# Patient Record
Sex: Female | Born: 1970 | Hispanic: No | Smoking: Never smoker
Health system: Southern US, Community
[De-identification: ages and names within clinical notes are randomized; demographics above are authoritative.]

## PROBLEM LIST (undated history)

## (undated) DIAGNOSIS — D649 Anemia, unspecified: Secondary | ICD-10-CM

## (undated) DIAGNOSIS — T7840XA Allergy, unspecified, initial encounter: Secondary | ICD-10-CM

## (undated) DIAGNOSIS — F419 Anxiety disorder, unspecified: Secondary | ICD-10-CM

## (undated) DIAGNOSIS — R0989 Other specified symptoms and signs involving the circulatory and respiratory systems: Secondary | ICD-10-CM

## (undated) HISTORY — DX: Other specified symptoms and signs involving the circulatory and respiratory systems: R09.89

## (undated) HISTORY — PX: BREAST SURGERY: SHX581

## (undated) HISTORY — DX: Allergy, unspecified, initial encounter: T78.40XA

## (undated) HISTORY — DX: Anemia, unspecified: D64.9

## (undated) HISTORY — PX: COSMETIC SURGERY: SHX468

## (undated) HISTORY — DX: Anxiety disorder, unspecified: F41.9

---

## 1998-03-20 ENCOUNTER — Encounter: Admission: RE | Admit: 1998-03-20 | Discharge: 1998-03-20 | Payer: Self-pay | Admitting: *Deleted

## 2010-12-09 ENCOUNTER — Other Ambulatory Visit: Payer: Self-pay | Admitting: Internal Medicine

## 2010-12-09 DIAGNOSIS — N6320 Unspecified lump in the left breast, unspecified quadrant: Secondary | ICD-10-CM

## 2010-12-09 DIAGNOSIS — Z1231 Encounter for screening mammogram for malignant neoplasm of breast: Secondary | ICD-10-CM

## 2010-12-10 ENCOUNTER — Other Ambulatory Visit: Payer: Self-pay | Admitting: Internal Medicine

## 2010-12-10 DIAGNOSIS — M5412 Radiculopathy, cervical region: Secondary | ICD-10-CM

## 2010-12-17 ENCOUNTER — Ambulatory Visit
Admission: RE | Admit: 2010-12-17 | Discharge: 2010-12-17 | Disposition: A | Discharge status: Home / Self Care | Payer: BC Managed Care – PPO | Source: Ambulatory Visit | Attending: Internal Medicine | Admitting: Internal Medicine

## 2010-12-17 DIAGNOSIS — M5412 Radiculopathy, cervical region: Secondary | ICD-10-CM

## 2010-12-18 ENCOUNTER — Ambulatory Visit
Admission: RE | Admit: 2010-12-18 | Discharge: 2010-12-18 | Disposition: A | Discharge status: Home / Self Care | Payer: BC Managed Care – PPO | Source: Ambulatory Visit | Attending: Internal Medicine | Admitting: Internal Medicine

## 2010-12-18 DIAGNOSIS — N6320 Unspecified lump in the left breast, unspecified quadrant: Secondary | ICD-10-CM

## 2011-12-03 ENCOUNTER — Other Ambulatory Visit: Payer: Self-pay | Admitting: Internal Medicine

## 2011-12-03 DIAGNOSIS — Z1231 Encounter for screening mammogram for malignant neoplasm of breast: Secondary | ICD-10-CM

## 2011-12-19 ENCOUNTER — Ambulatory Visit
Admission: RE | Admit: 2011-12-19 | Discharge: 2011-12-19 | Disposition: A | Discharge status: Home / Self Care | Payer: BC Managed Care – PPO | Source: Ambulatory Visit | Attending: Internal Medicine | Admitting: Internal Medicine

## 2011-12-19 ENCOUNTER — Other Ambulatory Visit: Payer: Self-pay | Admitting: Internal Medicine

## 2011-12-19 DIAGNOSIS — Z1231 Encounter for screening mammogram for malignant neoplasm of breast: Secondary | ICD-10-CM

## 2012-03-02 ENCOUNTER — Telehealth: Payer: Self-pay

## 2012-03-02 NOTE — Telephone Encounter (Signed)
Chart pulled to PA pool at nurse station [phone]

## 2012-03-02 NOTE — Telephone Encounter (Signed)
LMOM to RTC. 

## 2012-03-02 NOTE — Telephone Encounter (Signed)
Pt husband calling pt will be leaving to go [place] in a couple of days and pt is needing a rx refill on her diasapam and they would like to know what they need to do in order to have this shipped to them. [phone]

## 2012-03-02 NOTE — Telephone Encounter (Signed)
I do not see where we have Rx this for the patient in the last several years.  She will need an ov.

## 2012-03-02 NOTE — Telephone Encounter (Signed)
Please pull chart and route to PA pool 

## 2012-03-04 ENCOUNTER — Telehealth: Payer: Self-pay

## 2012-03-04 MED ORDER — DIAZEPAM 5 MG PO TABS: 5.0000 mg | ORAL_TABLET | Freq: Two times a day (BID) | ORAL | Status: DC | PRN

## 2012-03-04 MED ORDER — DIAZEPAM 5 MG PO TABS: 5.0000 mg | ORAL_TABLET | Freq: Two times a day (BID) | ORAL | Status: AC | PRN

## 2012-03-04 NOTE — Telephone Encounter (Signed)
PATIENT STATES SHE IS ON A SHIP IN [place]. SHE NEEDS TO GET A PRESCRIPTION FOR DIAZAPAM .5MG . DR. Merla Riches GIVES IT TO HER FOR ANXIETY AND IT HELPS HER TO RELAX HER HAM STRING MUSCLES SO SHE CAN WALK. SHE IS GOING TO BE GONE OUT OF THE COUNTRY FOR 15 DAYS. SHE SAID IF WE FAX IT TO HER HOME FAX #, IT WILL GO THROUGH HER INTERNET AND THEN THROUGH HER E-MAIL AND SHE CAN GET IT PRINTED OUT TO GIVE TO THE PHYSICIAN ON THE SHIP.  HOME FAX [phone]   PATIENT'S CELL IS [phone] - SHE SAID SOMETIMES SHE DOES NOT GET RECEPTION ON HER PHONE DEPENDING ON WHAT AREA SHE IS IN AT THE TIME. IF WE CALL AND DO NOT GET AN ANSWER, PLEASE LEAVE HER A MESSAGE BECAUSE SHE WILL BE ABLE TO LISTEN TO HER VOICE MAIL AND CALL [facility] WHEN SHE GETS CELL PHONE COVERAGE.      MBC

## 2012-03-04 NOTE — Telephone Encounter (Signed)
I have put a Rx into meds and printed.  Please follow her instructions so she can get the Rx.

## 2012-03-04 NOTE — Telephone Encounter (Signed)
Faxed for her/ unable to leave message subscriber outside of calling area.

## 2012-03-22 ENCOUNTER — Ambulatory Visit (INDEPENDENT_AMBULATORY_CARE_PROVIDER_SITE_OTHER): Payer: BC Managed Care – PPO | Admitting: Internal Medicine

## 2012-03-22 VITALS — BP 116/70 | HR 102 | Temp 98.0°F | Resp 16 | Ht 62.0 in | Wt 115.0 lb

## 2012-03-22 DIAGNOSIS — J019 Acute sinusitis, unspecified: Secondary | ICD-10-CM

## 2012-03-22 DIAGNOSIS — M542 Cervicalgia: Secondary | ICD-10-CM

## 2012-03-22 DIAGNOSIS — S79929A Unspecified injury of unspecified thigh, initial encounter: Secondary | ICD-10-CM

## 2012-03-22 DIAGNOSIS — S79919A Unspecified injury of unspecified hip, initial encounter: Secondary | ICD-10-CM

## 2012-03-22 DIAGNOSIS — M79606 Pain in leg, unspecified: Secondary | ICD-10-CM

## 2012-03-22 DIAGNOSIS — M79609 Pain in unspecified limb: Secondary | ICD-10-CM

## 2012-03-22 MED ORDER — DIAZEPAM 5 MG PO TABS: 5.0000 mg | ORAL_TABLET | Freq: Four times a day (QID) | ORAL | Status: DC | PRN

## 2012-03-22 MED ORDER — AMOXICILLIN 500 MG PO CAPS: 1000.0000 mg | ORAL_CAPSULE | Freq: Two times a day (BID) | ORAL | Status: AC

## 2012-03-22 MED ORDER — PROMETHAZINE-CODEINE 6.25-10 MG/5ML PO SYRP: 5.0000 mL | ORAL_SOLUTION | ORAL | Status: DC | PRN

## 2012-03-22 MED ORDER — MELOXICAM 15 MG PO TABS: 15.0000 mg | ORAL_TABLET | Freq: Every day | ORAL | Status: DC

## 2012-03-22 NOTE — Progress Notes (Signed)
  Subjective:    Patient ID: Sandra Mccann, female    DOB: 18-May-1971, 41 y.o.   MRN: [id number]  HPIRecent one-month trip to [place] Developed a right posterior thigh pain climbing steps to [employer] This has persisted/aggravated by exercise No numbness or weakness  Also complaining of a recent increase in neck pain following trip home/radiates to left trapezius C. History/responded to physical therapy O. Madilyn Fireman and is consistent with home exercises MRI 2009 showed mild degenerative changes  Developed a cough 3 weeks ago during her travels which is persisting Is productive Of green sputum especially in the a.m./no fever or night sweats/no sore throat/no chills/possible postnasal drip//Lots of morning congestion with purulent discharge    Review of Systems No fever chills or night sweats Gait unaffected No GU symptoms    Objective:   Physical Exam Vital signs stable HEENT clear except for purulent mucus both nares Throat clear no notes Chest clear to auscultation  Right thigh with tenderness in the posterior thigh at attachment on the iliac crest and into the mid body of the thigh/no exacerbation with dorsiflexion of foot/no cords or masses Hip range of motion intact Straight leg raise to 90 bilaterally normal Deep tendon reflexes symmetrical        Assessment & Plan:   1. Acute sinusitis, unspecified .Marland KitchenASSESSMENT:.amox 1 g twice a day 10 days/promethazine with codeine cough syrup  2. Leg pain -Muscular  3. Thigh injury -Muscle strain at the insertion right posterior thigh  4. Neck pain -Known mild degenerative disease Continue home exercises   Exercises described for thigh muscle strain Ice 20 minutes daily Mobic 15 mg daily Valium 5 mg as needed for muscle spasm/or sleep  Increase neck exercises  Followup if sinus symptoms not improved in one week

## 2012-05-10 ENCOUNTER — Encounter: Payer: Self-pay | Admitting: Internal Medicine

## 2012-05-13 ENCOUNTER — Telehealth: Payer: Self-pay

## 2012-05-13 NOTE — Telephone Encounter (Signed)
Pt says that we referred her to [facility], they have tried to contact this doctor and no phone calls are being returned would like to know if maybe we can refer to another doctor or try to get in touch with Sandra Mccann.

## 2012-05-13 NOTE — Telephone Encounter (Signed)
I called Sandra Mccann in Referrals, patient has made several calls to Sandra Mccann and has gotten no phone calls back, please advise if you can recommend another psychologist for this patient. Amy

## 2012-05-13 NOTE — Telephone Encounter (Signed)
Sandra Mccann at [facility]

## 2012-05-14 NOTE — Telephone Encounter (Signed)
[phone] is the contact # I have called and left message for patient to advise.

## 2012-05-19 ENCOUNTER — Telehealth: Payer: Self-pay | Admitting: Family Medicine

## 2012-05-19 MED ORDER — AMPHETAMINE-DEXTROAMPHETAMINE 10 MG PO TABS: 10.0000 mg | ORAL_TABLET | Freq: Two times a day (BID) | ORAL | Status: DC

## 2012-05-19 NOTE — Telephone Encounter (Signed)
Spoke with patient and she will come by and pick up RX.

## 2012-05-19 NOTE — Telephone Encounter (Signed)
i'd be glad to leave rx for her to pick up  today to start trying if she would like

## 2012-05-19 NOTE — Telephone Encounter (Signed)
Meds ordered this encounter  Medications  . amphetamine-dextroamphetamine (ADDERALL) 10 MG tablet    Sig: Take 1 tablet (10 mg total) by mouth 2 (two) times daily.    Dispense:  60 tablet    Refill:  0   F/u monday

## 2012-05-19 NOTE — Telephone Encounter (Signed)
Per Dr. Netta Corrigan message (Please call her today Psychologist says ahe is ADD by testing Needs atrial of meds???) called patient and notified she needs to come in and see him. She said she would come in Monday 11/25.

## 2012-05-24 ENCOUNTER — Ambulatory Visit (INDEPENDENT_AMBULATORY_CARE_PROVIDER_SITE_OTHER): Payer: BC Managed Care – PPO | Admitting: Internal Medicine

## 2012-05-24 VITALS — BP 99/60 | HR 71 | Temp 97.9°F | Resp 16 | Ht 62.0 in | Wt 111.0 lb

## 2012-05-24 DIAGNOSIS — M549 Dorsalgia, unspecified: Secondary | ICD-10-CM

## 2012-05-24 DIAGNOSIS — B009 Herpesviral infection, unspecified: Secondary | ICD-10-CM

## 2012-05-24 DIAGNOSIS — Z9882 Breast implant status: Secondary | ICD-10-CM

## 2012-05-24 DIAGNOSIS — F988 Other specified behavioral and emotional disorders with onset usually occurring in childhood and adolescence: Secondary | ICD-10-CM

## 2012-05-24 DIAGNOSIS — B001 Herpesviral vesicular dermatitis: Secondary | ICD-10-CM

## 2012-05-24 DIAGNOSIS — R519 Headache, unspecified: Secondary | ICD-10-CM

## 2012-05-24 DIAGNOSIS — M5030 Other cervical disc degeneration, unspecified cervical region: Secondary | ICD-10-CM

## 2012-05-24 MED ORDER — VALACYCLOVIR HCL 1 G PO TABS: 1000.0000 mg | ORAL_TABLET | Freq: Two times a day (BID) | ORAL | Status: DC

## 2012-05-24 NOTE — Progress Notes (Signed)
  Subjective:    Patient ID: Sandra Mccann, female    DOB: 1970/11/13, 41 y.o.   MRN: [id number]  HPI problem #1 recent diagnosis of attention deficit disorder Discussed patient with a value in psychologist Sandra Mccann who favored ADD as a diagnosis but who also was concerned about her level of depression related to the loss of her sister. She is appearing for the LSAT and has not started the medication yet She has used a relatives Adderall at 5 mg with good success for about 4 hours  Problem #2 relapsing low back pain Past MRI showed changes that were amenable to physical therapy and she was referred to PT Sandra Mccann with good success/now problems have recurred  Recently stressed with outbreak of lesions only a as is customary during times of stress  Using morning after pill for contraception when necessary/she dislikes condoms Depression due to sisters' death but in a settled place not affecting life Review of Systems No other symptoms noted    Objective:   Physical Exam Vital signs stable HEENT clear Heart regular Neurological intact       Assessment & Plan:  Problem #1 attention deficit disorder  To start with 10 mg Adderall twice a day and titrate  She may only need medicines in preparation for the LSAT  Problem #2 low back pain  She will reestablish for physical therapy and if fails it may be time for another MRI as the last one was 2009 Set up PE at 1 year after last one Problem #3 mild anxiety/depression/// she feels no need for therapy and I agree  Problem #4 herpes labialis-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 Prenatal Vit-Fe Fumarate-FA (MULTIVITAMIN-PRENATAL) 27-0.8 MG TABS    Sig: Take 1 tablet by mouth daily.  . valACYclovir (VALTREX) 1000 MG tablet    Sig: Take 1 tablet (1,000 mg total) by mouth 2 (two) times daily. At onset of lip blisters    Dispense:  6 tablet    Refill:  8   she has prescription for Adderall and will  followup in 4-8 weeks

## 2012-05-29 ENCOUNTER — Telehealth: Payer: Self-pay | Admitting: Internal Medicine

## 2012-05-29 DIAGNOSIS — M5030 Other cervical disc degeneration, unspecified cervical region: Secondary | ICD-10-CM | POA: Insufficient documentation

## 2012-05-29 DIAGNOSIS — B009 Herpesviral infection, unspecified: Secondary | ICD-10-CM | POA: Insufficient documentation

## 2012-05-29 DIAGNOSIS — Z9882 Breast implant status: Secondary | ICD-10-CM | POA: Insufficient documentation

## 2012-05-29 NOTE — Telephone Encounter (Signed)
Needs appt for CPE in January

## 2012-05-31 NOTE — Telephone Encounter (Signed)
Message left for patient to call to schedule CPE in January with Dr. Merla Riches. Sandra Mccann

## 2012-06-01 NOTE — Telephone Encounter (Signed)
CPE appt made for 07/14/12. Sandra Mccann

## 2012-06-08 ENCOUNTER — Other Ambulatory Visit: Payer: Self-pay | Admitting: Internal Medicine

## 2012-07-14 ENCOUNTER — Ambulatory Visit: Payer: BC Managed Care – PPO

## 2012-07-14 ENCOUNTER — Encounter: Payer: Self-pay | Admitting: Internal Medicine

## 2012-07-14 ENCOUNTER — Ambulatory Visit (INDEPENDENT_AMBULATORY_CARE_PROVIDER_SITE_OTHER): Payer: BC Managed Care – PPO | Admitting: Internal Medicine

## 2012-07-14 VITALS — BP 102/65 | HR 75 | Temp 98.1°F | Resp 16 | Ht 62.0 in | Wt 104.0 lb

## 2012-07-14 DIAGNOSIS — R5381 Other malaise: Secondary | ICD-10-CM

## 2012-07-14 DIAGNOSIS — Z0000 Encounter for general adult medical examination without abnormal findings: Secondary | ICD-10-CM

## 2012-07-14 DIAGNOSIS — M25559 Pain in unspecified hip: Secondary | ICD-10-CM

## 2012-07-14 DIAGNOSIS — M25552 Pain in left hip: Secondary | ICD-10-CM

## 2012-07-14 DIAGNOSIS — M545 Low back pain, unspecified: Secondary | ICD-10-CM

## 2012-07-14 DIAGNOSIS — R5383 Other fatigue: Secondary | ICD-10-CM

## 2012-07-14 DIAGNOSIS — M25551 Pain in right hip: Secondary | ICD-10-CM

## 2012-07-14 DIAGNOSIS — R634 Abnormal weight loss: Secondary | ICD-10-CM

## 2012-07-14 DIAGNOSIS — M542 Cervicalgia: Secondary | ICD-10-CM

## 2012-07-14 LAB — COMPREHENSIVE METABOLIC PANEL
ALT: 10 U/L (ref 0–35)
AST: 12 U/L (ref 0–37)
Albumin: 3.9 g/dL (ref 3.5–5.2)
Alkaline Phosphatase: 42 U/L (ref 39–117)
BUN: 10 mg/dL (ref 6–23)
Calcium: 9 mg/dL (ref 8.4–10.5)
Chloride: 106 mEq/L (ref 96–112)
Potassium: 4.2 mEq/L (ref 3.5–5.3)
Sodium: 137 mEq/L (ref 135–145)

## 2012-07-14 LAB — CBC WITH DIFFERENTIAL/PLATELET
Basophils Absolute: 0 10*3/uL (ref 0.0–0.1)
Basophils Relative: 1 % (ref 0–1)
Lymphocytes Relative: 45 % (ref 12–46)
MCHC: 34.1 g/dL (ref 30.0–36.0)
Neutro Abs: 2.8 10*3/uL (ref 1.7–7.7)
Neutrophils Relative %: 47 % (ref 43–77)
Platelets: 221 10*3/uL (ref 150–400)
RDW: 13.7 % (ref 11.5–15.5)
WBC: 5.9 10*3/uL (ref 4.0–10.5)

## 2012-07-14 LAB — POCT URINALYSIS DIPSTICK
Blood, UA: NEGATIVE
Nitrite, UA: NEGATIVE
Protein, UA: NEGATIVE
Spec Grav, UA: <=1.005
Urobilinogen, UA: 0.2

## 2012-07-14 LAB — LIPID PANEL
Cholesterol: 177 mg/dL (ref 0–200)
LDL Cholesterol: 115 mg/dL — ABNORMAL HIGH (ref 0–99)
Total CHOL/HDL Ratio: 4.4 Ratio
VLDL: 22 mg/dL (ref 0–40)

## 2012-07-14 LAB — POCT SEDIMENTATION RATE: POCT SED RATE: 28 mm/hr — AB (ref 0–22)

## 2012-07-14 MED ORDER — PRENATAL 27-0.8 MG PO TABS: 1.0000 | ORAL_TABLET | Freq: Every day | ORAL | Status: DC

## 2012-07-14 MED ORDER — MELOXICAM 15 MG PO TABS: 15.0000 mg | ORAL_TABLET | Freq: Every day | ORAL | Status: DC

## 2012-07-14 NOTE — Progress Notes (Signed)
Subjective:    Patient ID: Sandra Mccann, female    DOB: [birth year], 42 y.o.   MRN: [id number]  HPICPE Also with several complaints During recent trip to [place] she developed left hip pain which made it hard to walk. Her sister in law, a nurse in [place], injected her hip with Depo-Medrol and she improved. The pain has returned 3 weeks later and bothers her gait/unable to exercise in the gym. She has a more persistent longer-term problem with pain in the right hip at the lower buttock extending into the posterior thigh with an aching and burning sensation. This occurs mostly when sitting for long periods and occasionally at night. She rarely feels this pain with exercise. She also continues to have lumbosacral discomfort when sitting to work and occasional stiffness as she tries to begin exercise. She's usually very active. The back pain does not hurt at night. She has no numbness or weakness in her extremities  She visited her family in [place] for 3 weeks and had a good time. She sleeps well and feels no stress in [place]. Since returning last week her mood has been depressed. 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 She often has thoracic and neck tightness due to stress.   PMH- -DDDneck stable//MRI 12/17/10=sl disc bulge at 3-4,4-5,5-6(same on mri 12/09) -LDL 129 05/2011  Pap 2011 wnl/Mammo 10/13 wnl  FH- Mom 70s/working hard-healthy x mild aodm recent([place])  1 sis deceased melanoma(also GM)  SH-awaiting LSAT results  Review of Systems  Constitutional: Positive for fatigue and unexpected weight change. Negative for fever, chills, diaphoresis, activity change and appetite change.       -Weight loss from 1:15 to 104 since September 2013 without a change in appetite  -Mild pain R breast this week after travels/sharp-brief-better today  HENT: Positive for neck stiffness. Negative for hearing loss, ear pain, congestion and trouble  swallowing.        Recent vesicular lesion on lip responded to Valtrex  Eyes: Negative for visual disturbance.  Respiratory: Negative for cough and shortness of breath.   Cardiovascular: Negative for chest pain, palpitations and leg swelling.  Gastrointestinal: Negative for nausea, vomiting, abdominal pain, diarrhea and constipation.  Genitourinary: Negative for dysuria, frequency, difficulty urinating, menstrual problem and pelvic pain.  Musculoskeletal: Negative for joint swelling and arthralgias.  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       Hx menstr migraine but no recent probs since on ocps  Hematological: Negative for adenopathy. Does not bruise/bleed easily.  Psychiatric/Behavioral: Negative for behavioral problems and sleep disturbance.       Adderall helped with distractibility during study time that she does not intend to take this on a regular basis       Objective:   Physical Exam  Constitutional: She is oriented to person, place, and time. She appears well-developed.       Appears thin  HENT:  Right Ear: Tympanic membrane and external ear normal.  Left Ear: Tympanic membrane and external ear normal.  Nose: Nose normal. No septal deviation.  Mouth/Throat: Oropharynx is clear and moist.  Eyes: Conjunctivae normal and EOM are normal. Pupils are equal, round, and reactive to light.  Neck: Normal range of motion. Neck supple. No thyromegaly present.  Cardiovascular: Normal rate, regular rhythm, normal heart sounds and intact distal pulses.   No murmur heard. Pulmonary/Chest:  Effort normal and breath sounds normal. Right breast exhibits no inverted nipple, no mass, no nipple discharge, no skin change and no tenderness. Left breast exhibits no inverted nipple, no mass, no nipple discharge, no skin change and no tenderness. Breasts are symmetrical.  Abdominal: Soft. Bowel sounds are normal. She  exhibits no mass. There is no hepatosplenomegaly. There is no tenderness.  Musculoskeletal: She exhibits no edema.       The left hip has good range of motion with pain at full external rotation with the knee flexed, and with direct palpation over the trochanter/there is no crepitus  The right hip has tenderness directly on the issue of tuberosity and extending into the posterior thigh to mid thigh/no defect  There is tenderness bilaterally over the iliac crests which is mild and which does not affect range of motion of the lumbar spine/ straight leg raise to 90 is intact bilaterally  Lymphadenopathy:    She has no cervical adenopathy.  Neurological: She is alert and oriented to person, place, and time. She has normal reflexes. No cranial nerve deficit or sensory deficit. She exhibits normal muscle tone. Coordination normal.  Skin: No rash noted.  Psychiatric: She has a normal mood and affect. Her behavior is normal. Judgment and thought content normal.    [facility] reading (PRIMARY) by  Dr. Christianne Dolin hips and ls spine WNL  Results for orders placed in visit on 07/14/12  CBC WITH DIFFERENTIAL      Component Value Range   WBC 5.9  4.0 - 10.5 K/uL   RBC 4.25  3.87 - 5.11 MIL/uL   Hemoglobin 12.9  12.0 - 15.0 g/dL   HCT 09.8  11.9 - 14.7 %   MCV 88.9  78.0 - 100.0 fL   MCH 30.4  26.0 - 34.0 pg   MCHC 34.1  30.0 - 36.0 g/dL   RDW 82.9  56.2 - 13.0 %   Platelets 221  150 - 400 K/uL   Neutrophils Relative 47  43 - 77 %   Neutro Abs 2.8  1.7 - 7.7 K/uL   Lymphocytes Relative 45  12 - 46 %   Lymphs Abs 2.7  0.7 - 4.0 K/uL   Monocytes Relative 6  3 - 12 %   Monocytes Absolute 0.4  0.1 - 1.0 K/uL   Eosinophils Relative 1  0 - 5 %   Eosinophils Absolute 0.1  0.0 - 0.7 K/uL   Basophils Relative 1  0 - 1 %   Basophils Absolute 0.0  0.0 - 0.1 K/uL   Smear Review Criteria for review not met    COMPREHENSIVE METABOLIC PANEL      Component Value Range   Sodium 137  135 - 145 mEq/L    Potassium 4.2  3.5 - 5.3 mEq/L   Chloride 106  96 - 112 mEq/L   CO2 24  19 - 32 mEq/L   Glucose, Bld 85  70 - 99 mg/dL   BUN 10  6 - 23 mg/dL   Creat 8.65  7.84 - 6.96 mg/dL   Total Bilirubin 0.4  0.3 - 1.2 mg/dL   Alkaline Phosphatase 42  39 - 117 U/L   AST 12  0 - 37 U/L   ALT 10  0 - 35 U/L   Total Protein 6.5  6.0 - 8.3 g/dL   Albumin 3.9  3.5 - 5.2 g/dL   Calcium 9.0  8.4 - 29.5 mg/dL  TSH      Component Value  Range   TSH 1.596  0.350 - 4.500 uIU/mL  VITAMIN D 25 HYDROXY      Component Value Range   Vit D, 25-Hydroxy 28 (*) 30 - 89 ng/mL  LIPID PANEL      Component Value Range   Cholesterol 177  0 - 200 mg/dL   Triglycerides 161  <096 mg/dL   HDL 40  >04 mg/dL   Total CHOL/HDL Ratio 4.4     VLDL 22  0 - 40 mg/dL   LDL Cholesterol 540 (*) 0 - 99 mg/dL  POCT URINALYSIS DIPSTICK      Component Value Range   Color, UA yellow     Clarity, UA clear     Glucose, UA neg     Bilirubin, UA neg     Ketones, UA neg     Spec Grav, UA <=1.005     Blood, UA neg     pH, UA 5.5     Protein, UA neg     Urobilinogen, UA 0.2     Nitrite, UA neg     Leukocytes, UA Negative    POCT SEDIMENTATION RATE      Component Value Range   POCT SED RATE 28 (*) 0 - 22 mm/hr         Assessment & Plan:  Annual Exam P#1 Weight Loss P#2 fatigue P#3 L Hip Pain-bursitis P#4 R hip /thigh pain due to strain at ishial tuberosity P#5 DDD-cervical P#6 LBP-muscular P#7 mild anxiety  mobic 15 qd 1-2 months Ref back to PT O'Halloran Cont ocps(may ref 1 yr) Cont prn valium(rare use-call if out) Cont prenat vit(her choice) CBC,CMET,TSH,VIT D,Lipids as above-are reassuring Recheck weight in 1 month

## 2012-07-19 ENCOUNTER — Encounter: Payer: Self-pay | Admitting: Internal Medicine

## 2012-09-01 ENCOUNTER — Ambulatory Visit: Payer: BC Managed Care – PPO | Admitting: Internal Medicine

## 2012-09-22 ENCOUNTER — Other Ambulatory Visit: Payer: Self-pay | Admitting: Physician Assistant

## 2012-09-22 MED ORDER — NORETHIN-ETH ESTRAD TRIPHASIC 0.5/0.75/1-35 MG-MCG PO TABS: 1.0000 | ORAL_TABLET | Freq: Every day | ORAL | Status: DC

## 2012-09-22 NOTE — Telephone Encounter (Signed)
Done Meds ordered this encounter  Medications  . norethindrone-ethinyl estradiol (ORTHO-NOVUM 7/7/7, 28,) 0.5/0.75/1-35 MG-MCG tablet    Sig: Take 1 tablet by mouth daily.    Dispense:  28 tablet    Refill:  11    May refill through March 2015

## 2012-09-22 NOTE — Telephone Encounter (Signed)
Dr Merla Riches, pt saw you for CPE in Jan, but not sure whether BCP discussed. Do you want to RF BCPs for pt?

## 2012-12-02 ENCOUNTER — Other Ambulatory Visit: Payer: Self-pay

## 2012-12-02 DIAGNOSIS — Z1231 Encounter for screening mammogram for malignant neoplasm of breast: Secondary | ICD-10-CM

## 2012-12-10 ENCOUNTER — Other Ambulatory Visit: Payer: Self-pay | Admitting: Internal Medicine

## 2013-01-07 ENCOUNTER — Ambulatory Visit
Admission: RE | Admit: 2013-01-07 | Discharge: 2013-01-07 | Disposition: A | Discharge status: Home / Self Care | Payer: BC Managed Care – PPO | Source: Ambulatory Visit

## 2013-01-07 DIAGNOSIS — Z1231 Encounter for screening mammogram for malignant neoplasm of breast: Secondary | ICD-10-CM

## 2013-01-10 ENCOUNTER — Other Ambulatory Visit: Payer: Self-pay | Admitting: Internal Medicine

## 2013-01-10 DIAGNOSIS — N63 Unspecified lump in unspecified breast: Secondary | ICD-10-CM

## 2013-01-13 ENCOUNTER — Telehealth: Payer: Self-pay

## 2013-01-13 DIAGNOSIS — N6320 Unspecified lump in the left breast, unspecified quadrant: Secondary | ICD-10-CM

## 2013-01-13 NOTE — Telephone Encounter (Signed)
Ok ordered

## 2013-01-13 NOTE — Telephone Encounter (Signed)
Called patient she has gone for screening Mammogram. Was told she needs diagnostic order patient has a lump left breast. Also she would need [place] of left breast. Pended these, please sign if you agree, if you need to see patient, let me know and I will call her.

## 2013-01-13 NOTE — Telephone Encounter (Signed)
Pt is calling to talk with someone about getting a referral   Best number [phone]

## 2013-01-26 ENCOUNTER — Ambulatory Visit
Admission: RE | Admit: 2013-01-26 | Discharge: 2013-01-26 | Disposition: A | Discharge status: Home / Self Care | Payer: BC Managed Care – PPO | Source: Ambulatory Visit | Attending: Internal Medicine | Admitting: Internal Medicine

## 2013-01-26 ENCOUNTER — Other Ambulatory Visit: Payer: Self-pay | Admitting: Internal Medicine

## 2013-01-26 DIAGNOSIS — N6320 Unspecified lump in the left breast, unspecified quadrant: Secondary | ICD-10-CM

## 2013-01-26 DIAGNOSIS — N6310 Unspecified lump in the right breast, unspecified quadrant: Secondary | ICD-10-CM

## 2013-03-26 ENCOUNTER — Ambulatory Visit: Payer: BC Managed Care – PPO

## 2013-03-26 ENCOUNTER — Ambulatory Visit (INDEPENDENT_AMBULATORY_CARE_PROVIDER_SITE_OTHER): Payer: BC Managed Care – PPO | Admitting: Internal Medicine

## 2013-03-26 VITALS — BP 120/70 | HR 73 | Temp 98.2°F | Resp 18 | Ht 62.0 in | Wt 113.0 lb

## 2013-03-26 DIAGNOSIS — N644 Mastodynia: Secondary | ICD-10-CM

## 2013-03-26 DIAGNOSIS — M25559 Pain in unspecified hip: Secondary | ICD-10-CM

## 2013-03-26 DIAGNOSIS — N760 Acute vaginitis: Secondary | ICD-10-CM

## 2013-03-26 DIAGNOSIS — M542 Cervicalgia: Secondary | ICD-10-CM

## 2013-03-26 DIAGNOSIS — Z01419 Encounter for gynecological examination (general) (routine) without abnormal findings: Secondary | ICD-10-CM

## 2013-03-26 DIAGNOSIS — G8929 Other chronic pain: Secondary | ICD-10-CM

## 2013-03-26 LAB — POCT URINALYSIS DIPSTICK
Blood, UA: NEGATIVE
Nitrite, UA: NEGATIVE
Urobilinogen, UA: 0.2
pH, UA: 6

## 2013-03-26 LAB — POCT WET PREP WITH KOH
KOH Prep POC: NEGATIVE
Yeast Wet Prep HPF POC: NEGATIVE

## 2013-03-26 MED ORDER — NORETHIN-ETH ESTRAD TRIPHASIC 0.5/0.75/1-35 MG-MCG PO TABS: 1.0000 | ORAL_TABLET | Freq: Every day | ORAL | Status: DC

## 2013-03-26 MED ORDER — MELOXICAM 15 MG PO TABS: ORAL_TABLET | ORAL | Status: DC

## 2013-03-26 MED ORDER — DIAZEPAM 5 MG PO TABS: 5.0000 mg | ORAL_TABLET | Freq: Four times a day (QID) | ORAL | Status: DC | PRN

## 2013-03-26 NOTE — Progress Notes (Signed)
Subjective:    Patient ID: Sandra Mccann, female    DOB: 28-Feb-1971, 42 y.o.   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 Pain now affecting activity  Also needs PAP and ocp refills-doing well tho notes a funny burning sensation over the pubic mound not associated with urinary symptoms or with vaginal discharge/not really an itch Almost more of a pressure/comes and goes/seems to get worse if she sits for a long time to study Denies dyspareunia/married/no outside partner  Nephew/wife now staying with them--also stressful Husband wants to open brewery-working long hours to get trained  Review of Systems Otalgia L without change in hearing intermittently for the past month No nasal congestion or allergies No change in hearing Breast tenderness L-recent/? Swelling/US showed bubbling of prosthesis Cardiovascular negative Pulmonary negative Gastrointestinal negative    Objective:   Physical Exam BP 120/70  Pulse 73  Temp(Src) 98.2 F (36.8 C) (Oral)  Resp 18  Ht 5\' 2"  (1.575 m)  Wt 113 lb (51.256 kg)  BMI 20.66 kg/m2  SpO2 99%  LMP 03/18/2013 NAD Tms and canals clear-tender to palp over L TMJ/opens well and bites w/out pain Remainder of the EENT clear No thyromegaly Heart regular without murmur Lungs clear Abdomen supple without organomegaly There is a mild tenderness over the pubic ramus without a defined mass Introitus clear/os clear Uterus mid position and nontender No adnexal masses Neck with decreased range of motion secondary to discomfort with tenderness to palpation in the left posterior cervical  area No peripheral sensory or motor losses Straight leg raise to 90 within normal limits Spine straight Left hip tender to palpation along the iliotibial band proximally Hip has good range of motion/there is some pain with external rotation of the hip     [facility] reading (PRIMARY) by  Dr.Vittorio Mohs=no bony problems within pelvic girdle  Results for orders placed in visit on 03/26/13  POCT WET PREP WITH KOH      Result Value Range   Trichomonas, UA Negative     Clue Cells Wet Prep HPF POC 1-3     Epithelial Wet Prep HPF POC 5-10     Yeast Wet Prep HPF POC neg     Bacteria Wet Prep HPF POC 4+     RBC Wet Prep HPF POC 0-1     WBC Wet Prep HPF POC 0-1     KOH Prep POC Negative    POCT URINALYSIS DIPSTICK      Result Value Range   Color, UA light yellow     Clarity, UA clear     Glucose, UA neg     Bilirubin, UA neg     Ketones, UA neg     Spec Grav, UA <=1.005     Blood, UA neg     pH, UA 6.0     Protein, UA neg     Urobilinogen, UA 0.2     Nitrite, UA neg     Leukocytes, UA Negative       Assessment & Plan:  Pain in  pelvic region -?etiology---if all labs wnl will consider pelvic US  Neck pain-relapsing-- Refer Dr Penni Bombard  for eval/directing PT  Hip pain, chronic, left -recurrence--Refer Dr Penni Bombard for eval/directing PT  Routine gynecological examination - Plan: Pap IG, CT/NG w/ reflex HPV  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 norethindrone-ethinyl estradiol (ORTHO-NOVUM 7/7/7, 28,) 0.5/0.75/1-35 MG-MCG tablet    Sig: Take 1 tablet by mouth daily.    Dispense:  28 tablet    Refill:  11  . meloxicam (MOBIC) 15 MG tablet    Sig: TAKE 1 TABLET (15 MG TOTAL) BY MOUTH DAILY.     Dispense:  30 tablet    Refill:  2  . diazepam (VALIUM) 5 MG tablet    Sig: Take 1 tablet (5 mg total) by mouth every 6 (six) hours as needed for anxiety.    Dispense:  30 tablet    Refill:  5    OV

## 2013-03-28 LAB — PAP IG, CT-NG, RFX HPV ASCU: Chlamydia Probe Amp: NEGATIVE

## 2013-03-30 ENCOUNTER — Encounter: Payer: Self-pay | Admitting: Internal Medicine

## 2013-03-31 ENCOUNTER — Telehealth: Payer: Self-pay

## 2013-03-31 NOTE — Telephone Encounter (Signed)
Patient would like to get a generic prescription for birth control. The one that was prescribed previously is $30 and patient cannot afford. [facility].   [phone]

## 2013-03-31 NOTE — Telephone Encounter (Signed)
It looks like pt has always been on this particular pill.  The branded version comes with 7 placebo pills at the end of the pack.  The generic version just comes with the 21 active pills and no placebo.  It works the same way, but some people like the consistency of continuing to take the placebo.  If you do not have the placebo, you have to remember to restart the pill after a week.  So, we can do the generic version of the pill she is on, but without the placebo.  Or we can try a new generic pill.  Which would she prefer?

## 2013-04-01 NOTE — Telephone Encounter (Signed)
Left message for her to call me back. 

## 2013-04-03 NOTE — Telephone Encounter (Signed)
Called pharmacy and they did do the generic for pt.  I advised pt that she should call her ins to see what her formulary will pay for and call us back with what they will pay for.

## 2013-05-05 ENCOUNTER — Encounter: Payer: Self-pay | Admitting: *Deleted

## 2013-05-09 IMAGING — CR DG LUMBAR SPINE 2-3V
2 series · 2 of 2 positions shown · non-contrast
Comparison: None.

CLINICAL DATA: Back pain.

LUMBAR SPINE - 2-3 VIEW

[AP]
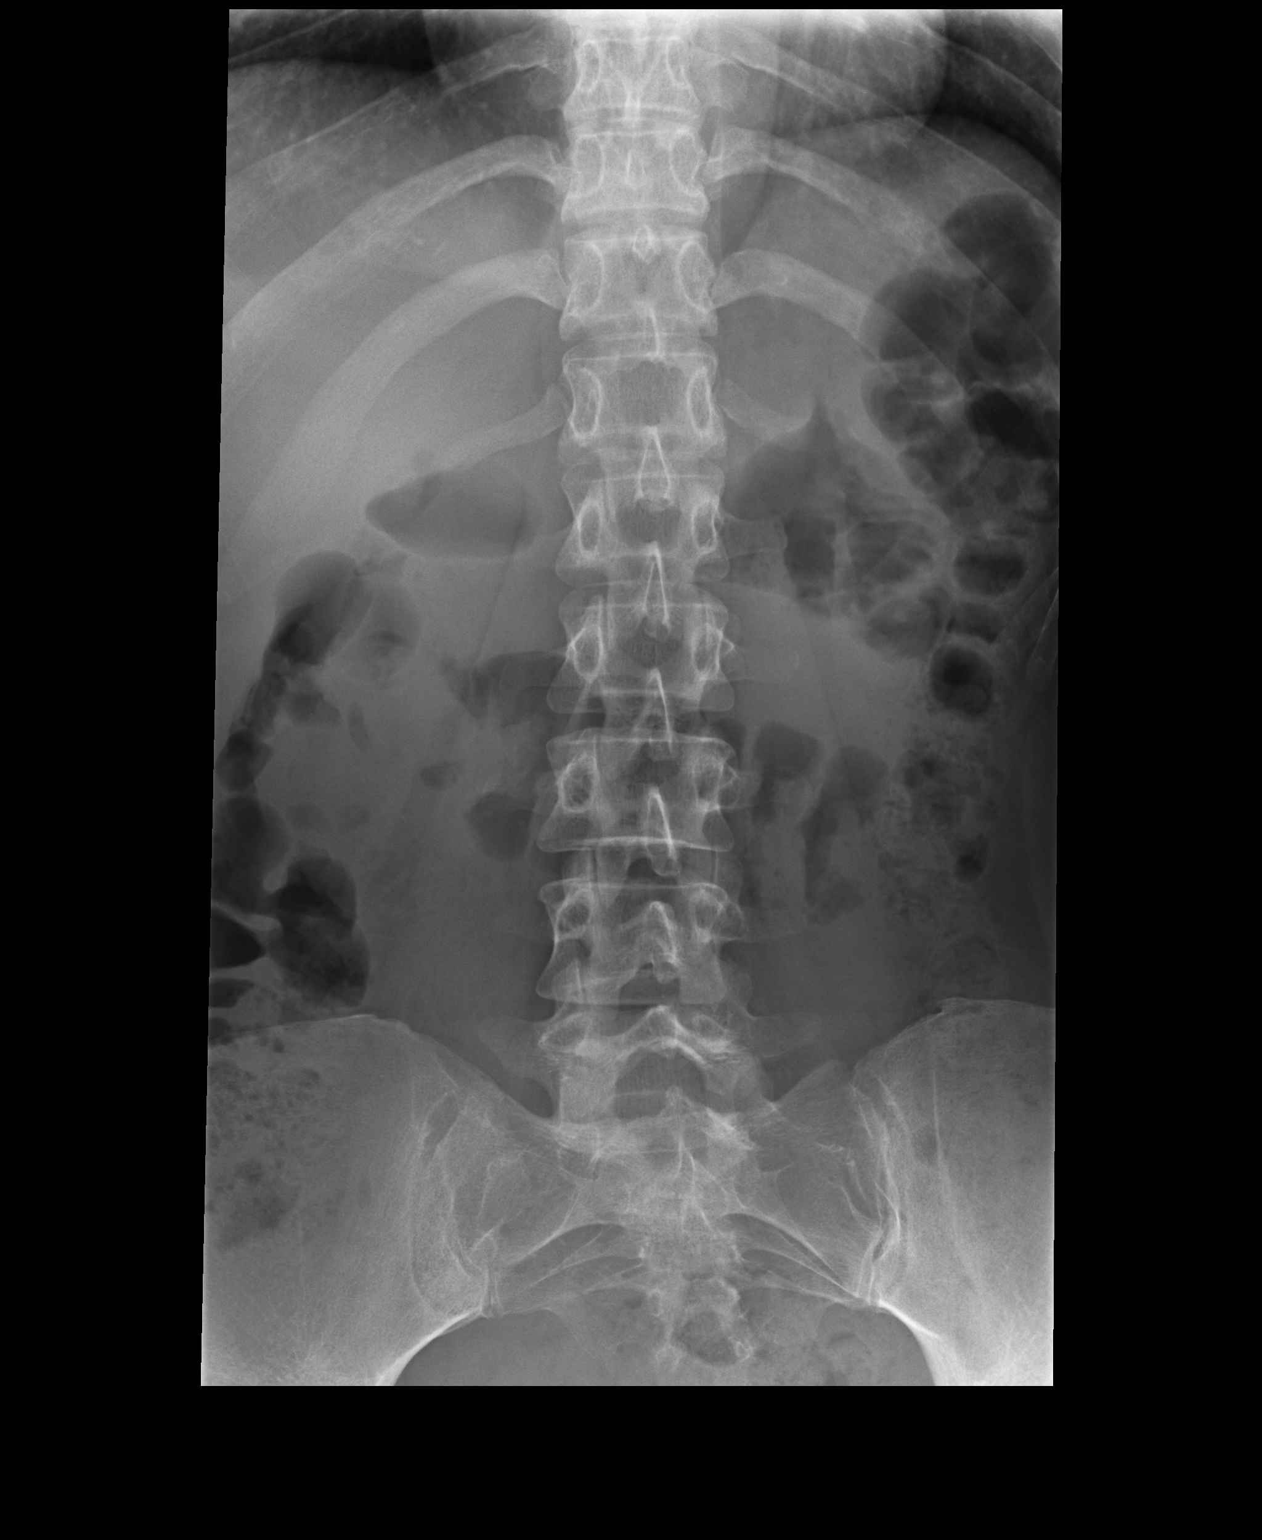

[oblique]
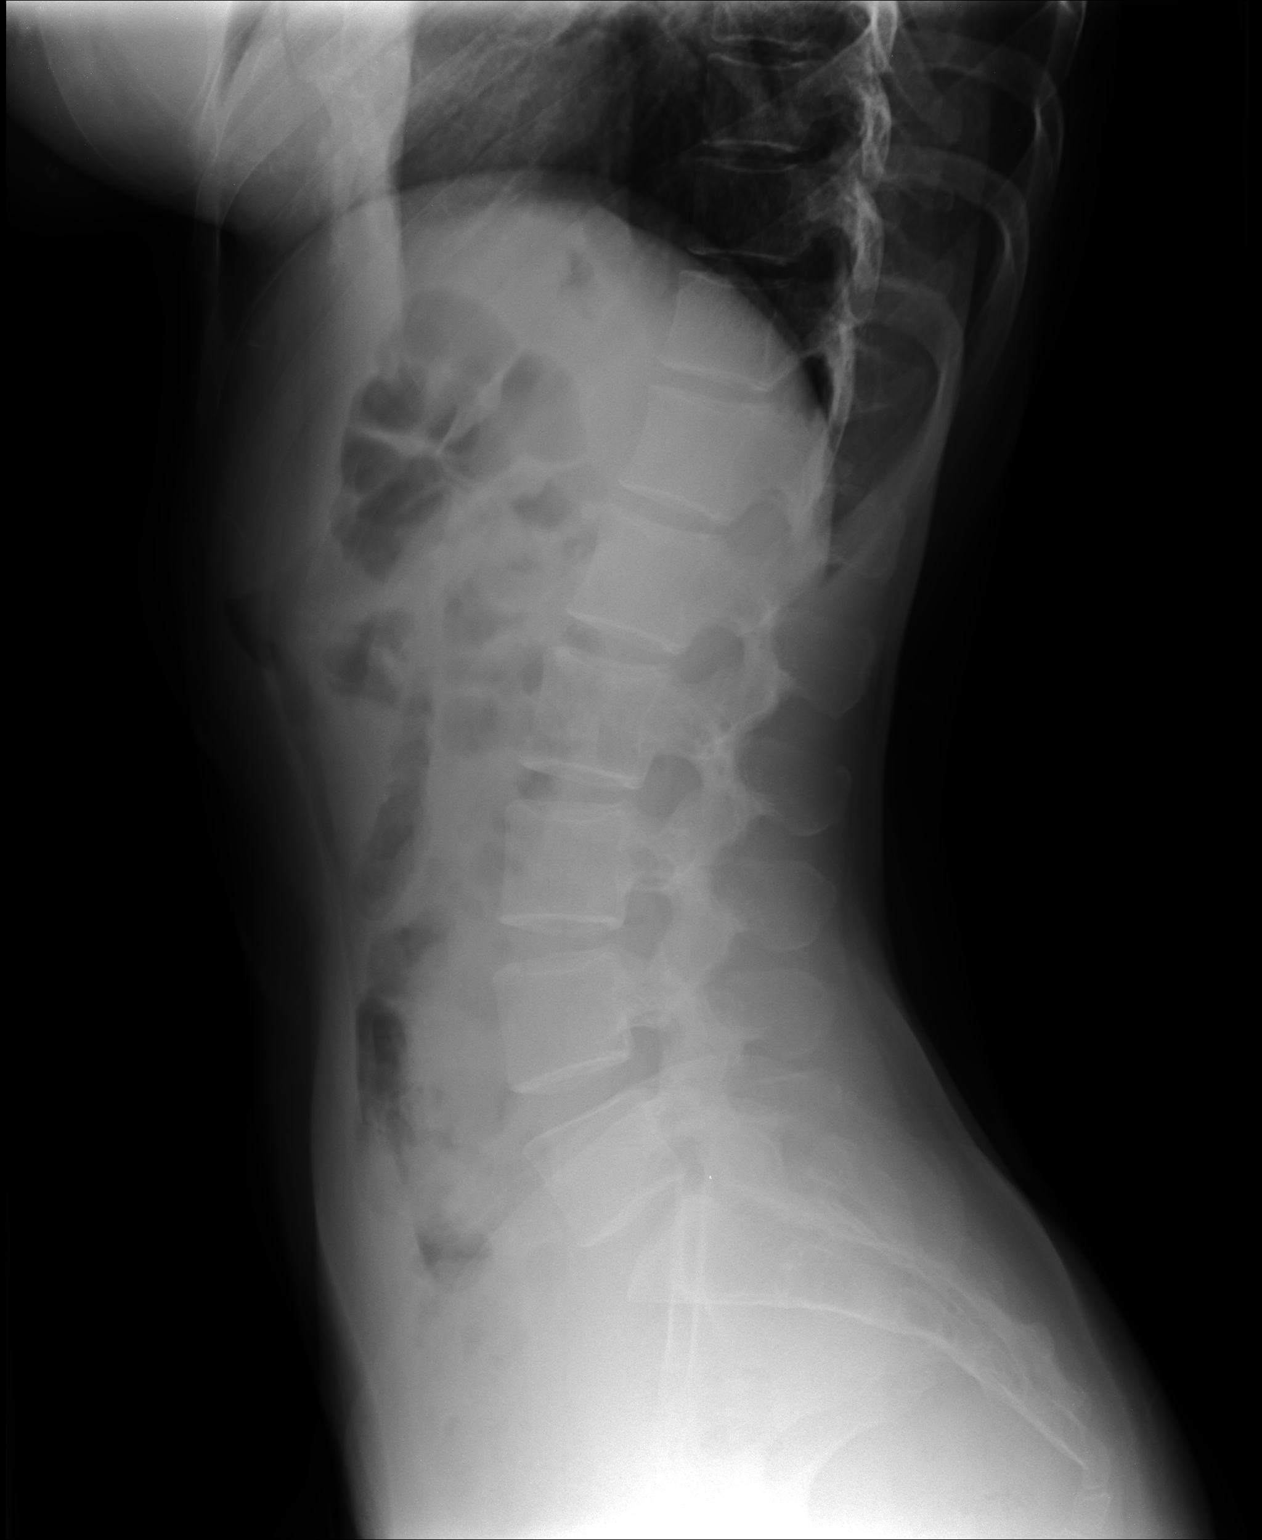

[2 of 2 positions shown; findings below may reference images not displayed]

FINDINGS: Five non-rib bearing lumbar type vertebral bodies are
present.  The two views demonstrate no acute bone or soft tissue
abnormality.  The vertebral body heights and alignment are normal.
IMPRESSION: Negative two-view lumbar spine.

## 2013-05-11 ENCOUNTER — Ambulatory Visit (INDEPENDENT_AMBULATORY_CARE_PROVIDER_SITE_OTHER): Payer: BC Managed Care – PPO | Admitting: Physician Assistant

## 2013-05-11 ENCOUNTER — Telehealth: Payer: Self-pay

## 2013-05-11 VITALS — BP 100/62 | HR 81 | Temp 98.0°F | Resp 16 | Ht 61.5 in | Wt 114.0 lb

## 2013-05-11 DIAGNOSIS — M25561 Pain in right knee: Secondary | ICD-10-CM

## 2013-05-11 DIAGNOSIS — M25559 Pain in unspecified hip: Secondary | ICD-10-CM

## 2013-05-11 DIAGNOSIS — M25569 Pain in unspecified knee: Secondary | ICD-10-CM

## 2013-05-11 DIAGNOSIS — M25551 Pain in right hip: Secondary | ICD-10-CM

## 2013-05-11 DIAGNOSIS — Z0000 Encounter for general adult medical examination without abnormal findings: Secondary | ICD-10-CM

## 2013-05-11 DIAGNOSIS — Z309 Encounter for contraceptive management, unspecified: Secondary | ICD-10-CM

## 2013-05-11 LAB — POCT CBC
Granulocyte percent: 54.1 %G (ref 37–80)
HCT, POC: 42.2 % (ref 37.7–47.9)
Hemoglobin: 13 g/dL (ref 12.2–16.2)
MPV: 10.5 fL (ref 0–99.8)
POC Granulocyte: 3 (ref 2–6.9)
POC LYMPH PERCENT: 41.2 %L (ref 10–50)
POC MID %: 4.7 %M (ref 0–12)
RBC: 4.4 M/uL (ref 4.04–5.48)
RDW, POC: 13.1 %

## 2013-05-11 LAB — LIPID PANEL
Cholesterol: 168 mg/dL (ref 0–200)
LDL Cholesterol: 114 mg/dL — ABNORMAL HIGH (ref 0–99)
Total CHOL/HDL Ratio: 3.8 Ratio
Triglycerides: 51 mg/dL (ref <150)

## 2013-05-11 LAB — COMPREHENSIVE METABOLIC PANEL
ALT: 12 U/L (ref 0–35)
Albumin: 4.6 g/dL (ref 3.5–5.2)
Alkaline Phosphatase: 64 U/L (ref 39–117)
BUN: 14 mg/dL (ref 6–23)
CO2: 24 mEq/L (ref 19–32)
Glucose, Bld: 99 mg/dL (ref 70–99)
Potassium: 4.7 mEq/L (ref 3.5–5.3)
Sodium: 139 mEq/L (ref 135–145)
Total Protein: 7.6 g/dL (ref 6.0–8.3)

## 2013-05-11 MED ORDER — MELOXICAM 15 MG PO TABS: ORAL_TABLET | ORAL | Status: DC

## 2013-05-11 MED ORDER — NORGESTIMATE-ETH ESTRADIOL 0.25-35 MG-MCG PO TABS: 1.0000 | ORAL_TABLET | Freq: Every day | ORAL | Status: DC

## 2013-05-11 NOTE — Telephone Encounter (Signed)
Pt brought in a form for PT therapy. I spoke w/Elizabeth who saw pt this morn for PE and stated that PT was not discussed at OV, advised that the PT must be related to Dr Doolittle's OV w/pt. Dr Merla Riches had referred pt to ortho, not for PT. Checked w/Donna who advised ortho appt has not been scheduled d/t a ? Ortho office had about this being a 2nd opinion. Lupita Leash will contact ortho office and remind them to call pt to schedule. LMOM for pt to CB to ask her about PT and if she is still willing to go to ortho. The PT order really should come from the ortho since Dr Merla Riches wanted her to see a specialist.

## 2013-05-11 NOTE — Patient Instructions (Signed)

## 2013-05-11 NOTE — Telephone Encounter (Signed)
This is the 2nd or 3rd time recently that patients have been given the runaround by [place] [facility] Do we have a problem there-this was a simple referral I am happy to change my referral to Dr Dorthula Nettles for eval of her ortho complaints

## 2013-05-11 NOTE — Progress Notes (Signed)
Subjective:    Patient ID: Sandra Mccann, female    DOB: Nov 05, 1970, 42 y.o.   MRN: [id number]  HPI   Sandra Mccann is a very pleasant 42 yr old female here for CPE.  Complaints:  None LMP:  05/09/13 Contraception:  None - would like to start OCP, last OCP was too expensive so stopped taking; no preference, has tolerated every pill well GYN: 03/26/13, pap normal; mammo July 2014, normal Dentist: twice per yr Eye doctor:  Dewaine Conger; eye doctor once year Imm:  Td 2005, flu shot 04/27/13 Diet:  At least 3 vegetables per day; tries to eat healthy; no soda or sweet tea Exercise:  decreased due to ongoing hip pain, but does try to exercise twice weekly PMH:  Right knee pain, right hip pain, "neurological problem" - states Dr. Merla Riches is aware of this, last saw him 03/26/13 Meds: meloxicam - needs refill Valium - once every two months for neck spasm Family history:  Sister melanoma - blood trans, hepatitis, deceased of cirrhosis Mother breast ca at 29, DM Father prostate ca, DM Tobacco:  never Etoh:  1 glass champagne per week  Work: full time Consulting civil engineer - criminal justice  Review of Systems  Constitutional: Negative.   HENT: Negative.   Respiratory: Negative.   Cardiovascular: Negative.   Gastrointestinal: Negative.   Genitourinary: Negative.   Musculoskeletal: Positive for arthralgias (right knee, right hip), neck pain (occ) and neck stiffness (occ).  Skin: Negative.   Neurological: Negative.        Objective:   Physical Exam  Vitals reviewed. Constitutional: She is oriented to person, place, and time. She appears well-developed and well-nourished. No distress.  HENT:  Head: Normocephalic and atraumatic.  Right Ear: Tympanic membrane and ear canal normal.  Left Ear: Tympanic membrane and ear canal normal.  Mouth/Throat: Uvula is midline, oropharynx is clear and moist and mucous membranes are normal.  Eyes: Conjunctivae and EOM are normal. Pupils are equal, round, and reactive  to light. No scleral icterus.  Neck: Normal range of motion. Neck supple.  Cardiovascular: Normal rate, regular rhythm and normal heart sounds.   Pulmonary/Chest: Effort normal and breath sounds normal. She has no wheezes. She has no rales.  Abdominal: Soft. Bowel sounds are normal. There is no tenderness.  Musculoskeletal: Normal range of motion.  Lymphadenopathy:    She has no cervical adenopathy.  Neurological: She is alert and oriented to person, place, and time. She has normal reflexes.  Skin: Skin is warm and dry.  Psychiatric: She has a normal mood and affect. Her behavior is normal.    Results for orders placed in visit on 05/11/13  POCT CBC      Result Value Range   WBC 5.5  4.6 - 10.2 K/uL   Lymph, poc 2.3  0.6 - 3.4   POC LYMPH PERCENT 41.2  10 - 50 %L   MID (cbc) 0.3  0 - 0.9   POC MID % 4.7  0 - 12 %M   POC Granulocyte 3.0  2 - 6.9   Granulocyte percent 54.1  37 - 80 %G   RBC 4.40  4.04 - 5.48 M/uL   Hemoglobin 13.0  12.2 - 16.2 g/dL   HCT, POC 40.9  81.1 - 47.9 %   MCV 95.9  80 - 97 fL   MCH, POC 29.5  27 - 31.2 pg   MCHC 30.8 (*) 31.8 - 35.4 g/dL   RDW, POC 91.4     Platelet Count, POC  189  142 - 424 K/uL   MPV 10.5  0 - 99.8 fL       Assessment & Plan:  Routine general medical examination at a health care facility - Plan: POCT CBC, Comprehensive metabolic panel, Lipid panel, TSH  -- Sandra Mccann is a very pleasant 42 yr old female here for CPE.  She appears to be in good health and exam is normal.  Normal pap in Sept 2104, next due 2017.  Normal mammo July 2014, repeat annually.  CBC is normal today.  CMP, lipid panel, TSH pending.  Will complete pt's ins ppw when labs are back.  Discussed health maintenance and provided pt edu materials for pt.    Knee pain, right - Plan: meloxicam (MOBIC) 15 MG tablet  -- Has been referred to ortho/pt by Dr. Merla Riches.  Refilled meloxicam  Hip pain, right - Plan: meloxicam (MOBIC) 15 MG tablet  -- Has been referred to  ortho/pt by Dr. Merla Riches.  Refilled meloxicam  Contraception management - Plan: norgestimate-ethinyl estradiol (ORTHO-CYCLEN,SPRINTEC,PREVIFEM) 0.25-35 MG-MCG tablet  -- Previously rx'd ortho novum 7/7/7 but this was too expensive so pt stopped taking.  Has tolerated other pills in the past, would like to try something less expensive.  Will try sprintec.  Pt to call if trouble with ins coverage.   Meds ordered this encounter  Medications  . meloxicam (MOBIC) 15 MG tablet    Sig: TAKE 1 TABLET (15 MG TOTAL) BY MOUTH DAILY.    Dispense:  30 tablet    Refill:  2    Order Specific Question:  Supervising Provider    Answer:  Ethelda Chick [[id number]]  . norgestimate-ethinyl estradiol (ORTHO-CYCLEN,SPRINTEC,PREVIFEM) 0.25-35 MG-MCG tablet    Sig: Take 1 tablet by mouth daily.    Dispense:  3 Package    Refill:  4    Order Specific Question:  Supervising Provider    Answer:  Nilda Simmer M [[id number]]    Loleta Dicker MHS, PA-C [facility] 11/12/201410:34 AM

## 2013-05-11 NOTE — Telephone Encounter (Signed)
Pt CB and stated that the ortho office had called her and said they couldn't schedule an OV because some PPW was needed. I have asked Lupita Leash to check on this referral, and advised pt that she should be getting another call from ortho office. Advised her that I'm not sure if Dr Merla Riches will want to order the PT or wait for ortho eval and have ortho order what he thinks is needed. Pt agreed and will wait for Dr Doolittle's review and plan. She brought the form to [place] because it is the only facility that is covered by her ins. I have placed form in Dr [employer] box. Dr Merla Riches, [place] Childes.

## 2013-05-12 NOTE — Telephone Encounter (Signed)
I closed the last message encounter rather than x out after writing message---did you get it or does that cancel sending it?? i'll x out here

## 2013-05-13 NOTE — Telephone Encounter (Signed)
Lupita Leash, please let Dr Merla Riches know when you are able to get pt an appt either with [facility] or Dr Dion Saucier so that he can make decision about whether to order PT for pt or wait for ortho appt. Thank you.

## 2013-05-17 NOTE — Telephone Encounter (Signed)
Did you do referral to Dr Dion Saucier???

## 2013-05-18 NOTE — Telephone Encounter (Signed)
Lupita Leash had sent a third correspondence to [facility] yesterday and no one is in Referrals today. I called Dr Shelba Flake office myself and got pt an appt on 05/25/13 at 4 pm. Sent OV notes, xray reports and demo/ins info to Dr Shelba Flake office and notified pt of appt. Pt agreed to appt time and will also stop and p/up a copy of the actual xrays from 07/14/12 and 03/26/13.

## 2013-05-18 NOTE — Telephone Encounter (Signed)
Thank you thank you thank you--maybe I'll quit referring to [facility]

## 2013-05-18 NOTE — Telephone Encounter (Signed)
Would you check to see if ref to [facility] made, as I'm getting no response to my question

## 2013-06-06 ENCOUNTER — Telehealth: Payer: Self-pay

## 2013-06-06 NOTE — Telephone Encounter (Signed)
Dr.Dolittle, Pt would like for you to write her an rx for a cheap birth control. Pt states that she currently spends $30 per month but that is becoming to expensive for her she is looking for a birth control  that has a co-pay of around $76. Best# [phone] Pharmacy: [facility] on [address]

## 2013-06-07 ENCOUNTER — Telehealth: Payer: Self-pay

## 2013-06-07 NOTE — Telephone Encounter (Signed)
Can you please give her a prescription for a cheaper birth control?

## 2013-06-07 NOTE — Telephone Encounter (Signed)
Called patient. Left message for her to call me back.  

## 2013-06-07 NOTE — Telephone Encounter (Signed)
Patient would like a refill

## 2013-06-07 NOTE — Telephone Encounter (Signed)
Pt is already on a generic pill which should have no copay.  Have her call her ins company and see what their preferred OCPs are, then we can chose from their list rather than just guessing

## 2013-06-13 NOTE — Telephone Encounter (Signed)
Patient has not responded to my calls, have sent letter.

## 2013-07-27 ENCOUNTER — Telehealth: Payer: Self-pay

## 2013-07-27 NOTE — Telephone Encounter (Signed)
Patient is requesting 3 months of birth control medication for payment purposes.   [facility] [address]   [phone]

## 2013-07-27 NOTE — Telephone Encounter (Signed)
Called pharm to check on RFs and they have RFs on hold but when processed, show cost to pt of $92 for generic Sprintec. Called pt and advised that she call ins and see what brand BCP will be least expensive. Pt agreed and stated she is supposed to get some for free on plan and will contact her ins to straighten it out.

## 2013-07-27 NOTE — Telephone Encounter (Signed)
In November I rx'd a year's worth of birth control pills dispensing 3 packages at a time.  She should still have refills at the pharmacy

## 2013-10-17 ENCOUNTER — Telehealth: Payer: Self-pay

## 2013-10-17 NOTE — Telephone Encounter (Signed)
Does she need us to order mri

## 2013-10-17 NOTE — Telephone Encounter (Signed)
Pt is needing to personal talk with dr Merla Richesdoolittle about getting an mri she was referred to an ortho office and being treated for hamstring injury but since then gone to [place] and re injuried the hamstring and given an rx for an mri from [place]  Best number [phone]

## 2013-12-18 ENCOUNTER — Other Ambulatory Visit: Payer: Self-pay | Admitting: Physician Assistant

## 2014-01-11 ENCOUNTER — Other Ambulatory Visit: Payer: Self-pay

## 2014-01-11 DIAGNOSIS — Z1231 Encounter for screening mammogram for malignant neoplasm of breast: Secondary | ICD-10-CM

## 2014-01-19 IMAGING — CR DG PELVIS 1-2V
1 series · 1 of 1 positions shown · non-contrast
Comparison: 07/14/2012

CLINICAL DATA: Pelvic pain

EXAM:
PELVIS - 1-2 VIEW

[AP]
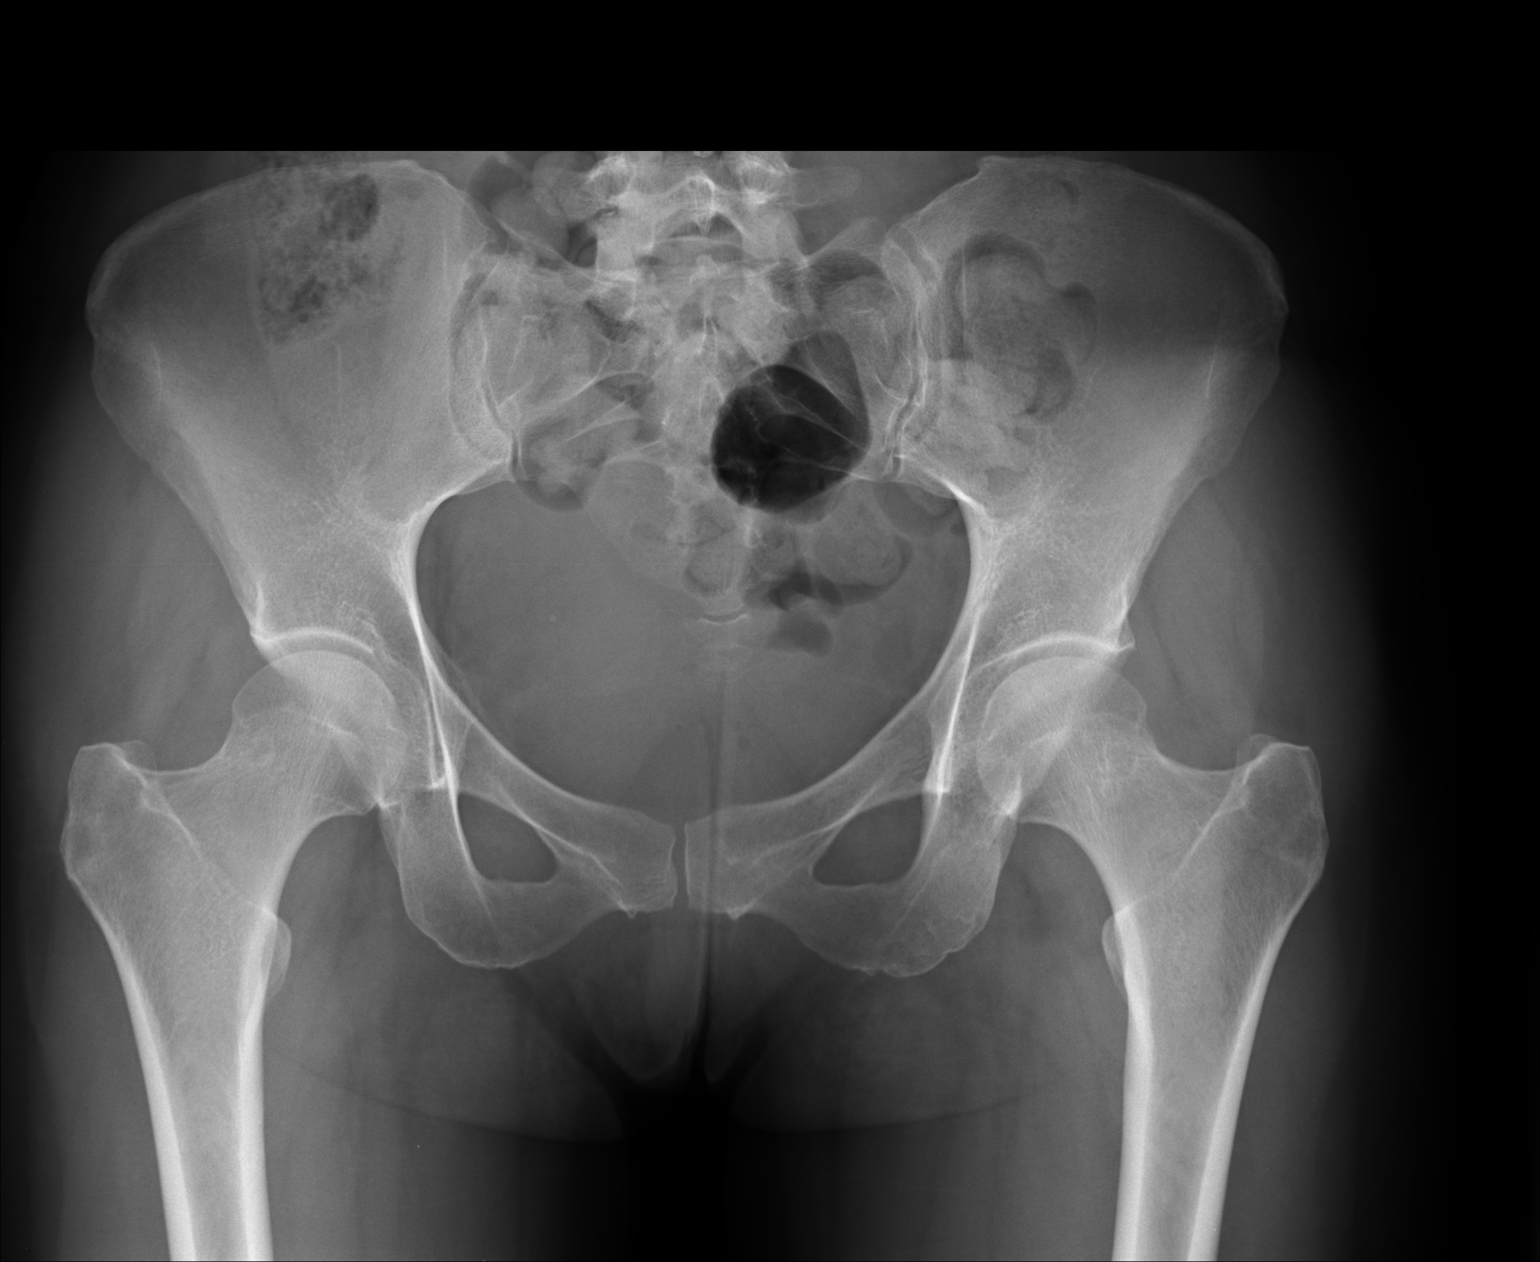

[1 of 1 positions shown; findings below may reference images not displayed]

FINDINGS: No fracture or dislocation is seen.

Bilateral hip joint spaces are preserved.

Visualized bony pelvis appears intact.

Lower lumbar spine is unremarkable.
IMPRESSION: No acute osseous abnormality is seen.

## 2014-02-01 ENCOUNTER — Ambulatory Visit
Admission: RE | Admit: 2014-02-01 | Discharge: 2014-02-01 | Disposition: A | Discharge status: Home / Self Care | Payer: Commercial Indemnity | Source: Ambulatory Visit

## 2014-02-01 ENCOUNTER — Encounter (INDEPENDENT_AMBULATORY_CARE_PROVIDER_SITE_OTHER): Payer: Self-pay

## 2014-02-01 DIAGNOSIS — Z1231 Encounter for screening mammogram for malignant neoplasm of breast: Secondary | ICD-10-CM

## 2014-03-02 ENCOUNTER — Telehealth: Payer: Self-pay

## 2014-03-02 NOTE — Telephone Encounter (Signed)
[facility] requesting a referral put in for physical therapy at [facility]? Patient says her pcp is Dr. Merla Riches. Please advise. Thank you

## 2014-03-02 NOTE — Telephone Encounter (Signed)
LM to advise pt to RTC for referral.

## 2014-03-03 ENCOUNTER — Other Ambulatory Visit: Payer: Self-pay

## 2014-03-03 MED ORDER — NORGESTIMATE-ETH ESTRADIOL 0.25-35 MG-MCG PO TABS: 1.0000 | ORAL_TABLET | Freq: Every day | ORAL | Status: DC

## 2014-04-12 ENCOUNTER — Telehealth: Payer: Self-pay

## 2014-04-12 NOTE — Telephone Encounter (Signed)
Sandra Mccann - Pt left forms for her and her husband, Sandra Mccann, for your to fill out.  They are health assessment forms for their health insurance.  They both had CPEs in November last year.  She says if they need to come in they will.  Please call her at [phone].  These forms have been left in your box.

## 2014-04-14 NOTE — Telephone Encounter (Signed)
Out til Monday--will do forms next week

## 2014-04-14 NOTE — Telephone Encounter (Signed)
Left message on machine to call back  

## 2014-04-17 ENCOUNTER — Encounter: Payer: Self-pay | Admitting: Internal Medicine

## 2014-04-21 ENCOUNTER — Ambulatory Visit (INDEPENDENT_AMBULATORY_CARE_PROVIDER_SITE_OTHER): Payer: Managed Care, Other (non HMO) | Admitting: Family Medicine

## 2014-04-21 ENCOUNTER — Other Ambulatory Visit: Payer: Self-pay | Admitting: Internal Medicine

## 2014-04-21 VITALS — BP 108/62 | HR 67 | Temp 98.0°F | Resp 17 | Ht 61.0 in | Wt 113.0 lb

## 2014-04-21 DIAGNOSIS — M5431 Sciatica, right side: Secondary | ICD-10-CM

## 2014-04-21 NOTE — Telephone Encounter (Signed)
LEFT MESSAGE TO CALL BACK ABOUT REFILL ON BIRTH CONTROL.

## 2014-04-21 NOTE — Progress Notes (Deleted)
   Subjective:    Patient ID: Sandra Mccann, female    DOB: Jul 07, 1970, 43 y.o.   MRN: [id number]  HPI    Review of Systems     Objective:   Physical Exam        Assessment & Plan:

## 2014-04-21 NOTE — Progress Notes (Signed)
Agree with Ms. Mitzi DavenportBrewington PA-C.

## 2014-04-21 NOTE — Progress Notes (Signed)
   Subjective:    Patient ID: Sandra Mccann, female    DOB: 09/12/1970, 43 y.o.   MRN: [id number]  HPI Patient presents to clinic for ortho referral. She has an ongoing history of musculoskeletal problems for over 4 years and was referred last back in April 2015 for hamstring tightness and an uneven pelvis. Currently, has pain and stiffness of her L hamstring and R buttock when walking more than usual or when sitting for extended periods of time. Has had numbness and tingling of toes with extended periods of sitting. Denies history of ortho surgeries and his maintained with physical therapy, meloxicam, and tramadol. Denies incontinence.  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 She says that muscles atrophied and has not been the same since. Outside of PT, she does not exercise. 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 Would like referral to Dr. Deberah CastleYaste, sports med ortho, because other former dancers that she knows have recommended him.     Review of Systems  Constitutional: Positive for activity change.  Genitourinary: Negative for enuresis and pelvic pain.       No incontinence.  Musculoskeletal: Positive for myalgias. Negative for arthralgias, back pain, gait problem and joint swelling.  Neurological: Positive for numbness (secondary to sitting/inactivity). Negative for weakness.       Objective:   Physical Exam  Constitutional: She is oriented to person, place, and time. She appears well-developed and well-nourished. No distress.  Blood pressure 108/62, pulse 67, temperature 98 F (36.7 C), temperature source Oral, resp. rate 17, height 5\' 1"  (1.549 m), weight 113 lb (51.256 kg), last menstrual period 03/30/2014, SpO2 99.00%.   HENT:  Head: Normocephalic and atraumatic.  Right Ear: External ear normal.   Left Ear: External ear normal.  Nose: Nose normal.  Mouth/Throat: Oropharynx is clear and moist. No oropharyngeal exudate.  Eyes: Conjunctivae and EOM are normal. Pupils are equal, round, and reactive to light. Right eye exhibits no discharge. Left eye exhibits no discharge.  Neck: Normal range of motion and full passive range of motion without pain. Neck supple. No spinous process tenderness and no muscular tenderness present.  Cardiovascular: Normal rate, regular rhythm, normal heart sounds and intact distal pulses.  Exam reveals no gallop and no friction rub.   No murmur heard. Pulmonary/Chest: Effort normal and breath sounds normal. She has no wheezes. She has no rales.  Abdominal: Soft. Bowel sounds are normal. She exhibits no mass. There is no tenderness.  Musculoskeletal: Normal range of motion. She exhibits no edema and no tenderness.  Lymphadenopathy:    She has no cervical adenopathy.  Neurological: She is alert and oriented to person, place, and time. She has normal strength and normal reflexes. She displays no atrophy. No cranial nerve deficit or sensory deficit. She exhibits normal muscle tone. Coordination and gait normal.  Skin: Skin is warm and dry. No rash noted. She is not diaphoretic. No erythema. No pallor.       Assessment & Plan:  1. Sciatic pain, right - Ambulatory referral to Orthopedic Surgery. - Continue PT exercises and avoid long periods of inactivity.   [name] PA-C  [facility] 04/21/2014 2:41 PM

## 2014-06-09 ENCOUNTER — Ambulatory Visit (INDEPENDENT_AMBULATORY_CARE_PROVIDER_SITE_OTHER): Payer: Managed Care, Other (non HMO) | Admitting: Internal Medicine

## 2014-06-09 VITALS — BP 118/64 | HR 80 | Temp 97.8°F | Resp 16 | Ht 61.75 in | Wt 109.0 lb

## 2014-06-09 DIAGNOSIS — M25552 Pain in left hip: Secondary | ICD-10-CM

## 2014-06-09 DIAGNOSIS — G8929 Other chronic pain: Secondary | ICD-10-CM

## 2014-06-09 DIAGNOSIS — M542 Cervicalgia: Secondary | ICD-10-CM

## 2014-06-09 DIAGNOSIS — Z0000 Encounter for general adult medical examination without abnormal findings: Secondary | ICD-10-CM

## 2014-06-09 DIAGNOSIS — N926 Irregular menstruation, unspecified: Secondary | ICD-10-CM

## 2014-06-09 DIAGNOSIS — Z23 Encounter for immunization: Secondary | ICD-10-CM

## 2014-06-09 LAB — CBC WITH DIFFERENTIAL/PLATELET
BASOS PCT: 0 % (ref 0–1)
Basophils Absolute: 0 10*3/uL (ref 0.0–0.1)
Eosinophils Absolute: 0.1 10*3/uL (ref 0.0–0.7)
Eosinophils Relative: 1 % (ref 0–5)
HCT: 36.9 % (ref 36.0–46.0)
Hemoglobin: 12 g/dL (ref 12.0–15.0)
LYMPHS ABS: 2.1 10*3/uL (ref 0.7–4.0)
LYMPHS PCT: 41 % (ref 12–46)
MCH: 29.3 pg (ref 26.0–34.0)
MCHC: 32.5 g/dL (ref 30.0–36.0)
MCV: 90.2 fL (ref 78.0–100.0)
MONOS PCT: 5 % (ref 3–12)
MPV: 11.7 fL (ref 9.4–12.4)
Monocytes Absolute: 0.3 10*3/uL (ref 0.1–1.0)
NEUTROS ABS: 2.7 10*3/uL (ref 1.7–7.7)
NEUTROS PCT: 53 % (ref 43–77)
Platelets: 202 10*3/uL (ref 150–400)
RBC: 4.09 MIL/uL (ref 3.87–5.11)
RDW: 13.3 % (ref 11.5–15.5)
WBC: 5.1 10*3/uL (ref 4.0–10.5)

## 2014-06-09 LAB — COMPREHENSIVE METABOLIC PANEL
ALT: 14 U/L (ref 0–35)
AST: 19 U/L (ref 0–37)
Albumin: 4.2 g/dL (ref 3.5–5.2)
Alkaline Phosphatase: 37 U/L — ABNORMAL LOW (ref 39–117)
BUN: 12 mg/dL (ref 6–23)
CALCIUM: 9.1 mg/dL (ref 8.4–10.5)
CHLORIDE: 103 meq/L (ref 96–112)
CO2: 21 meq/L (ref 19–32)
Creat: 0.66 mg/dL (ref 0.50–1.10)
Glucose, Bld: 89 mg/dL (ref 70–99)
Potassium: 4.4 mEq/L (ref 3.5–5.3)
SODIUM: 137 meq/L (ref 135–145)
TOTAL PROTEIN: 7 g/dL (ref 6.0–8.3)
Total Bilirubin: 0.4 mg/dL (ref 0.2–1.2)

## 2014-06-09 LAB — LIPID PANEL
CHOLESTEROL: 155 mg/dL (ref 0–200)
HDL: 50 mg/dL (ref >39)
LDL Cholesterol: 88 mg/dL (ref 0–99)
Total CHOL/HDL Ratio: 3.1 Ratio
Triglycerides: 83 mg/dL (ref <150)
VLDL: 17 mg/dL (ref 0–40)

## 2014-06-09 MED ORDER — NORETHINDRONE-ETH ESTRADIOL 1-35 MG-MCG PO TABS: 1.0000 | ORAL_TABLET | Freq: Every day | ORAL | Status: DC

## 2014-06-09 MED ORDER — CYCLOBENZAPRINE HCL 10 MG PO TABS: 10.0000 mg | ORAL_TABLET | Freq: Every day | ORAL | Status: DC

## 2014-06-09 MED ORDER — TRAMADOL HCL 50 MG PO TABS: 50.0000 mg | ORAL_TABLET | Freq: Three times a day (TID) | ORAL | Status: DC | PRN

## 2014-06-09 NOTE — Progress Notes (Signed)
Subjective:   This chart was scribed for Ellamae Siaobert Carrianne Hyun, MD by Jarvis Morganaylor Ferguson, Medical Scribe. This patient was seen in Room 14 and the patient's care was started at 12:15 PM.   Patient ID: Sandra NeerAdriana Mccann, female    DOB: September 20, 1970, 43 y.o.   MRN: [id number]  Chief Complaint  Patient presents with  . CPE    HPI HPI Comments: Sandra Mccann is a 43 y.o. female who [facility] the [facility] for her complete annual exam. She has been going to physical therapy for her issues with her right hip. She has left hamstring tightness and an uneven pelvis. She notes a lot of improvement upon the physical therapy exercises-in [place] where they use mult modalities.   Pt also has some pain in her neck-chronic, and jaw pain-from clinching,tho this is better now. 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 She has been wearing a mouth guard at night and that is providing relief.   At a previous visit pt had discussed getting her breast implants taken out but pt has decided against it now.   She reports some complaints with her current birth control medication. She states she has been having some associated nausea and abnormal bleeding with the [facility] medication. She would like to try a new medication. She was changed to Sprintec by her insurance company. She had done well for years on Ortho-Novum 7/7/7. The difference should be in the progesterone content.  Pt also states she needs a prescription for her chronic musculoskeletal pain, she has previously been taking Meloxicam but she reports that it does not touch her pain. Pt reports that the pain sometimes will keep her up at night. Pt had an MRI of her left pelvis because she was noticing a lump in the area with pain but the MRI was negative.   She notes some trouble sleeping at night for which she takes Melatonin but wanted to discuss what are the healthiest options to take. She says that her mind tends  to raise sometimes at night and it causes her to be unable to fall asleep. She does note some success with Melatonin.   She states it has been over 10 years since her last tetanus shot. She is also requesting her annual flu shot.   She reports that her HAs have gotten much better than previously. Pt denies any other complaints at this time.  Patient Active Problem List   Diagnosis Date Noted  . 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 DDD (degenerative disc disease), cervical 05/29/2012  . HA (headache)-menstrual migraine 05/29/2012  . H/O breast implant 05/29/2012  . ADD (attention deficit disorder) 05/24/2012  . Back pain 05/24/2012    Past Surgical History  Procedure Laterality Date  . Breast surgery    . Cosmetic surgery     Allergies  Allergen Reactions  . Bee Venom Rash   Prior to Admission medications   Medication Sig Start Date End Date Taking? Authorizing Provider  diazepam (VALIUM) 5 MG tablet Take 1 tablet (5 mg total) by mouth every 6 (six) hours as needed for anxiety. 03/26/13  Yes Tonye Pearsonobert P Kenyatta Keidel, MD  meloxicam (MOBIC) 15 MG tablet TAKE 1 TABLET BY MOUTH EVERY DAY 12/18/13  Yes Morrell RiddleSarah L Weber, PA-C  norgestimate-ethinyl estradiol (ORTHO-CYCLEN, 28,) 0.25-35 MG-MCG tablet TAKE 1 TABLET BY MOUTH DAILY  F/U NEEDED FOR ADDITIONAL REFILLS 04/21/14  Yes Morrell RiddleSarah L Weber, PA-C   History  Social History  . Marital Status:  married to Minerva Areola     Spouse Name: N/A    Number of Children: 0    Occupational History  .  Business    Social History Main Topics  . Smoking status: Never Smoker   . Smokeless tobacco: Never Used  . Alcohol Use: 0.5 oz/week    0 Glasses of wine, 1 Not specified per week  . Drug Use: No  . Sexual Activity: Yes    Birth Control/ Protection: Pill       Review of Systems  Gastrointestinal: Positive for nausea (due to [facility] medication).  Genitourinary: Positive for menstrual problem.  Musculoskeletal: Positive for myalgias, arthralgias and neck  pain.  All other systems reviewed and are negative.      Objective:   Physical Exam  Constitutional: She is oriented to person, place, and time. She appears well-developed and well-nourished. No distress.  HENT:  Head: Normocephalic and atraumatic.  Right Ear: External ear normal.  Left Ear: External ear normal.  Nose: Nose normal.  Mouth/Throat: Oropharynx is clear and moist.  Eyes: Conjunctivae and EOM are normal. Pupils are equal, round, and reactive to light.  Neck: Normal range of motion. No thyromegaly present.  There is still some restriction of motion to the cervical spine with secondary discomfort  Cardiovascular: Normal rate, regular rhythm, normal heart sounds and intact distal pulses.   No murmur heard. Pulmonary/Chest: Effort normal and breath sounds normal. No respiratory distress. She has no wheezes.  Breasts have a normal symmetrical appearance. There are no masses. The breast implants feel fully dilated. Sensation intact  Abdominal: Soft. Bowel sounds are normal. She exhibits no distension and no mass. There is no tenderness. There is no rebound.  Musculoskeletal: Normal range of motion. She exhibits no edema.  She has some tenderness to palpation over the greater trochanter but no loss of range of motion  Lymphadenopathy:    She has no cervical adenopathy.  Neurological: She is alert and oriented to person, place, and time. She has normal reflexes. No cranial nerve deficit.  Skin: Skin is warm and dry. No rash noted.  Psychiatric: She has a normal mood and affect. Her behavior is normal. Judgment and thought content normal.  Nursing note and vitals reviewed.  Filed Vitals:   06/09/14 1029  BP: 118/64  Pulse: 80  Temp: 97.8 F (36.6 C)  Resp: 16        Assessment & Plan:  Annual physical exam - Plan: CBC with Differential, Comprehensive metabolic panel, Lipid panel, Flu Vaccine QUAD 36+ mos IM, Tdap vaccine greater than or equal to 7yo IM  Needs flu shot  - Plan: Flu Vaccine QUAD 36+ mos IM  Need for Tdap vaccination - Plan: Tdap vaccine greater than or equal to 7yo IM  Hip pain, left - Plan: Ambulatory referral to Physical Therapy  Neck pain of over 3 months duration - Plan: Ambulatory referral to Physical Therapy  Menstrual disorder--secondary to change birth control pill Will return to a pill with norethindrone    Meds ordered this encounter  Medications  . traMADol (ULTRAM) 50 MG tablet--- she needs this occasionally for hip pain in order to sleep     Sig: Take 1 tablet (50 mg total) by mouth every 8 (eight) hours as needed.    Dispense:  60 tablet    Refill:  5  . cyclobenzaprine (FLEXERIL) 10 MG tablet ---she also uses this occasionally to sleep ---    Sig:  Take 1 tablet (10 mg total) by mouth at bedtime.    Dispense:  90 tablet    Refill:  3    I personally performed the services described in this documentation, which was scribed in my presence. The recorded information has been reviewed and is accurate.I have completed the patient encounter in its entirety as documented by the scribe, with editing by me where necessary. Detta Mellin P. Merla Richesoolittle, M.D.

## 2014-06-11 ENCOUNTER — Encounter: Payer: Self-pay | Admitting: Internal Medicine

## 2014-10-17 ENCOUNTER — Telehealth: Payer: Self-pay

## 2014-10-17 ENCOUNTER — Ambulatory Visit (INDEPENDENT_AMBULATORY_CARE_PROVIDER_SITE_OTHER): Payer: Managed Care, Other (non HMO) | Admitting: Internal Medicine

## 2014-10-17 VITALS — BP 100/64 | HR 78 | Temp 97.5°F | Resp 18 | Ht 61.75 in | Wt 112.4 lb

## 2014-10-17 DIAGNOSIS — N651 Disproportion of reconstructed breast: Secondary | ICD-10-CM | POA: Diagnosis not present

## 2014-10-17 DIAGNOSIS — Z124 Encounter for screening for malignant neoplasm of cervix: Secondary | ICD-10-CM

## 2014-10-17 DIAGNOSIS — Z3009 Encounter for other general counseling and advice on contraception: Secondary | ICD-10-CM | POA: Diagnosis not present

## 2014-10-17 DIAGNOSIS — J330 Polyp of nasal cavity: Secondary | ICD-10-CM | POA: Diagnosis not present

## 2014-10-17 DIAGNOSIS — R0981 Nasal congestion: Secondary | ICD-10-CM | POA: Diagnosis not present

## 2014-10-17 LAB — POCT URINE PREGNANCY: PREG TEST UR: NEGATIVE

## 2014-10-17 MED ORDER — NORETHINDRONE-ETH ESTRADIOL 1-35 MG-MCG PO TABS: 1.0000 | ORAL_TABLET | Freq: Every day | ORAL | Status: AC

## 2014-10-17 NOTE — Telephone Encounter (Signed)
lmom to cb. 

## 2014-10-17 NOTE — Progress Notes (Signed)
Subjective:    Patient ID: Sandra Mccann, female    DOB: 1971-05-01, 44 y.o.   MRN: [id number]  This chart was scribed for [name], MD by Murriel Hopper, ED Scribe. The patient's care was started at 4:22 PM.   Chief Complaint  Patient presents with  . Polyps    On Nose  . Gynecologic Exam    Only if covered by Insurance. Pt states she will lose her insurance in 1 week     HPI  HPI Comments: Sandra Mccann is a 44 y.o. female who presents to [facility] for an office visit. Pt states that her husband was laid off and that they will lose their health insurance in two weeks. Pt is requesting to receive a pap smear and a one year-long prescription of birth control. Her last Pap 2014 was within normal limits. She remains married with one partner and has no current complaints. Her birth control pill was changed in [place] note. She did miss a month of pills and restarted with her menses 1 week ago   Pt also notes a painful place in the left side of her nose.she often has trouble breathing through this nostril. She is worried about having a nasal polyp and  requests to see a specialist for that.   She has a h/o breast implants and states she is due for them to be replaced. Pt states she has had the same ones for 12 years. Saline. She feels like there is a size difference between the implants in the left breast is larger than the right. Has noticed no cellulitis or discoloration or dimpling. Her last mammogram was normal showing no deterioration. Her operation was done in [facility]   Past Medical History  Diagnosis Date  . Allergy   . Anemia   . Anxiety   . Sinus complaint    Prior to Admission medications   Medication Sig Start Date End Date Taking? Authorizing Provider  cyclobenzaprine (FLEXERIL) 10 MG tablet Take 1 tablet (10 mg total) by mouth at bedtime. 06/09/14  Yes Tonye Pearson, MD  diazepam (VALIUM) 5 MG tablet Take 1  tablet (5 mg total) by mouth every 6 (six) hours as needed for anxiety. 03/26/13  Yes Tonye Pearson, MD  meloxicam (MOBIC) 15 MG tablet TAKE 1 TABLET BY MOUTH EVERY DAY 12/18/13  Yes Morrell Riddle, PA-C  norethindrone-ethinyl estradiol 1/35 (ORTHO-NOVUM, NORTREL,CYCLAFEM) tablet Take 1 tablet by mouth daily. 06/09/14  Yes Tonye Pearson, MD  traMADol (ULTRAM) 50 MG tablet Take 1 tablet (50 mg total) by mouth every 8 (eight) hours as needed. Patient not taking: Reported on 10/17/2014 06/09/14   Tonye Pearson, MD   Allergies  Allergen Reactions  . Bee Venom Rash     Review of Systems  Constitutional: Negative for fever, fatigue and unexpected weight change.  HENT: Negative for facial swelling, postnasal drip, rhinorrhea, sinus pressure, sneezing, sore throat and trouble swallowing.   Eyes: Negative for visual disturbance.  Respiratory: Negative for cough and shortness of breath.   Cardiovascular: Negative for chest pain and palpitations.  Gastrointestinal: Negative for abdominal pain.  Genitourinary: Negative for vaginal discharge, vaginal pain, menstrual problem and pelvic pain.  Musculoskeletal:       Neck pain is much improved now that she has been pain more attention to diet and exercise  Neurological: Negative for headaches.  Psychiatric/Behavioral: Negative for behavioral problems and sleep disturbance. The patient is not nervous/anxious.  Objective:   Physical Exam  BP 100/64 mmHg  Pulse 78  Temp(Src) 97.5 F (36.4 C) (Oral)  Resp 18  Ht 5' 1.75" (1.568 m)  Wt 112 lb 6.4 oz (50.984 kg)  BMI 20.74 kg/m2  SpO2 99% HEENT-TMs clear/PERRLA and EOMs conjugate  Inside the left nares is a slightly engorged turbinate with the question of a polyp behind  No discharge present/not palpably tender from the external aspect No thyromegaly or lymphadenopathy Heart regular without murmur Breasts with only slight asymmetry L>R--barely noticeable//no loss of fullness or  skin changes Introitus clear Os clear Uterus mid position and not enlarged or tender No adnexal masses or tenderness Extremities with no edema and full peripheral pulses Mood good and affect appropriate       Assessment & Plan:  Screening for cervical cancer - Plan: Pap IG and HPV (high risk) DNA detection  Encounter for other general counseling or advice on contraception - Plan: POCT urine pregnancy= negative so it should be fine for her to continue her oral contraceptives and these are refilled for 1 year (generic for Ortho-Novum 1/35)  Chronic nasal congestion - Plan: Ambulatory referral to ENT ?Nasal polyp - anterior  Breast asymmetry following reconstructive surgery Discussed the possibility of reevaluation by Dr. Benna DunksBarber [facility]  Meds ordered this encounter  Medications  . norethindrone-ethinyl estradiol 1/35 (ORTHO-NOVUM, NORTREL,CYCLAFEM) tablet    Sig: Take 1 tablet by mouth daily.    Dispense:  1 Package    Refill:  11     I have completed the patient encounter in its entirety as documented by the scribe, with editing by me where necessary. Robert P. Merla Richesoolittle, M.D.

## 2014-10-17 NOTE — Telephone Encounter (Signed)
Pt would like to speak with Dr.Doolittle or his asst about the test to get her BCP. Please call [phone]

## 2014-10-18 LAB — PAP IG AND HPV HIGH-RISK: HPV DNA High Risk: NOT DETECTED

## 2014-10-19 NOTE — Telephone Encounter (Signed)
Spoke with pt, she saw Dr. Merla Richesoolittle on Tuesday.

## 2015-04-10 ENCOUNTER — Other Ambulatory Visit: Payer: Self-pay

## 2015-04-10 NOTE — Telephone Encounter (Signed)
Pharm reqs RFs of tramadol and meloxicam. Dr Merla Riches, you saw pt for check up in April, but don't see these meds discussed since 05/2014. Pended both for review w/notes that pt needs OV for more.

## 2015-04-13 MED ORDER — TRAMADOL HCL 50 MG PO TABS: 50.0000 mg | ORAL_TABLET | Freq: Three times a day (TID) | ORAL | Status: DC | PRN

## 2015-04-13 MED ORDER — MELOXICAM 15 MG PO TABS: 15.0000 mg | ORAL_TABLET | Freq: Every day | ORAL | Status: DC

## 2015-04-16 NOTE — Telephone Encounter (Signed)
Called in.

## 2015-04-23 ENCOUNTER — Other Ambulatory Visit: Payer: Self-pay | Admitting: Internal Medicine

## 2016-06-21 ENCOUNTER — Ambulatory Visit (INDEPENDENT_AMBULATORY_CARE_PROVIDER_SITE_OTHER): Payer: BC Managed Care – PPO | Admitting: Family Medicine

## 2016-06-21 VITALS — BP 112/62 | HR 89 | Temp 98.2°F | Ht 61.75 in | Wt 119.2 lb

## 2016-06-21 DIAGNOSIS — F902 Attention-deficit hyperactivity disorder, combined type: Secondary | ICD-10-CM | POA: Diagnosis not present

## 2016-06-21 DIAGNOSIS — G5702 Lesion of sciatic nerve, left lower limb: Secondary | ICD-10-CM

## 2016-06-21 DIAGNOSIS — J029 Acute pharyngitis, unspecified: Secondary | ICD-10-CM | POA: Diagnosis not present

## 2016-06-21 DIAGNOSIS — M5030 Other cervical disc degeneration, unspecified cervical region: Secondary | ICD-10-CM

## 2016-06-21 DIAGNOSIS — F411 Generalized anxiety disorder: Secondary | ICD-10-CM | POA: Diagnosis not present

## 2016-06-21 DIAGNOSIS — Z3041 Encounter for surveillance of contraceptive pills: Secondary | ICD-10-CM | POA: Diagnosis not present

## 2016-06-21 LAB — POCT RAPID STREP A (OFFICE): Rapid Strep A Screen: NEGATIVE

## 2016-06-21 MED ORDER — PSEUDOEPHEDRINE HCL ER 120 MG PO TB12: 120.0000 mg | ORAL_TABLET | Freq: Two times a day (BID) | ORAL | 0 refills | Status: AC | PRN

## 2016-06-21 MED ORDER — MELOXICAM 15 MG PO TABS: 15.0000 mg | ORAL_TABLET | Freq: Every day | ORAL | 0 refills | Status: AC | PRN

## 2016-06-21 MED ORDER — TRAMADOL HCL 50 MG PO TABS: 50.0000 mg | ORAL_TABLET | Freq: Three times a day (TID) | ORAL | 0 refills | Status: DC | PRN

## 2016-06-21 MED ORDER — DIAZEPAM 5 MG PO TABS: 5.0000 mg | ORAL_TABLET | Freq: Every day | ORAL | 0 refills | Status: AC | PRN

## 2016-06-21 MED ORDER — AMPHETAMINE-DEXTROAMPHETAMINE 10 MG PO TABS: 10.0000 mg | ORAL_TABLET | Freq: Every day | ORAL | 0 refills | Status: AC

## 2016-06-21 MED ORDER — CYCLOBENZAPRINE HCL 5 MG PO TABS: 5.0000 mg | ORAL_TABLET | Freq: Three times a day (TID) | ORAL | 0 refills | Status: AC | PRN

## 2016-06-21 MED ORDER — AMPHETAMINE-DEXTROAMPHETAMINE 10 MG PO TABS: 10.0000 mg | ORAL_TABLET | Freq: Every day | ORAL | 0 refills | Status: DC

## 2016-06-21 MED ORDER — DIAZEPAM 5 MG PO TABS: 5.0000 mg | ORAL_TABLET | Freq: Every day | ORAL | 0 refills | Status: DC | PRN

## 2016-06-21 MED ORDER — TRAMADOL HCL 50 MG PO TABS: 50.0000 mg | ORAL_TABLET | Freq: Three times a day (TID) | ORAL | 0 refills | Status: AC | PRN

## 2016-06-21 NOTE — Progress Notes (Signed)
Subjective:    Patient ID: Sandra Mccann, female    DOB: 28-Jan-1971, 45 y.o.   MRN: [id number]  06/21/2016  Sore Throat (X 1 week ) and Medication Refill   HPI This 45 y.o. female presents for sore throat.   Onset one week ago.  Has been taking honey, lemon.  [place] on [place]24[place]16.  No fever but +sore throat; +l ear pain.  Dry throat and itching throat at night. During the day, feels good. Lost voice.  Has nighttime malaise.  Mucinex.  Warm tea is soothing.  Pain recurs every night.     Anxiety: usually takes Valium once monthly on average; needs refill.   Admits to feeling overwhelmed.  Plans to get GRE; studying a lot.    Piriformis syndrome and DDD cervical: needs refill of Tramadol and Meloxicam and Flexeril.    OCP: still taking OCP.   Review of Systems  Constitutional: Negative for chills, diaphoresis, fatigue and fever.  HENT: Positive for ear pain, sore throat and voice change. Negative for trouble swallowing.   Eyes: Negative for visual disturbance.  Respiratory: Negative for cough and shortness of breath.   Cardiovascular: Negative for chest pain, palpitations and leg swelling.  Gastrointestinal: Negative for abdominal pain, constipation, diarrhea, nausea and vomiting.  Endocrine: Negative for cold intolerance, heat intolerance, polydipsia, polyphagia and polyuria.  Musculoskeletal: Positive for back pain and myalgias.  Neurological: Negative for dizziness, tremors, seizures, syncope, facial asymmetry, speech difficulty, weakness, light-headedness, numbness and headaches.  Psychiatric/Behavioral: Negative for self-injury and sleep disturbance. The patient is nervous/anxious.     Past Medical History:  Diagnosis Date  . Allergy   . Anemia   . Anxiety   . Sinus complaint    Past Surgical History:  Procedure Laterality Date  . BREAST SURGERY    . COSMETIC SURGERY     Allergies  Allergen Reactions  . Bee Venom Rash   Current Outpatient  Prescriptions  Medication Sig Dispense Refill  . cyclobenzaprine (FLEXERIL) 5 MG tablet Take 1 tablet (5 mg total) by mouth 3 (three) times daily as needed for muscle spasms. 90 tablet 0  . diazepam (VALIUM) 5 MG tablet Take 1 tablet (5 mg total) by mouth daily as needed for anxiety. 30 tablet 0  . meloxicam (MOBIC) 15 MG tablet Take 1 tablet (15 mg total) by mouth daily as needed for pain. 90 tablet 0  . norethindrone-ethinyl estradiol 1/35 (ORTHO-NOVUM, NORTREL,CYCLAFEM) tablet Take 1 tablet by mouth daily. 1 Package 11  . traMADol (ULTRAM) 50 MG tablet Take 1 tablet (50 mg total) by mouth every 8 (eight) hours as needed. 60 tablet 0  . amoxicillin (AMOXIL) 500 MG tablet Take 2 tablets (1,000 mg total) by mouth 2 (two) times daily. 40 tablet 0  . amphetamine-dextroamphetamine (ADDERALL) 10 MG tablet Take 1 tablet (10 mg total) by mouth daily before breakfast. 30 tablet 0  . pseudoephedrine (SUDAFED) 120 MG 12 hr tablet Take 1 tablet (120 mg total) by mouth every 12 (twelve) hours as needed for congestion. 20 tablet 0   No current facility-administered medications for this visit.    Social History   Social History  . Marital status: Unknown    Spouse name: N/A  . Number of children: N/A  . Years of education: N/A   Occupational History  . Not on file.   Social History Main Topics  . Smoking status: Never Smoker  . Smokeless tobacco: Never Used  . Alcohol use 0.5 oz/week  1 Standard drinks or equivalent per week  . Drug use: No  . Sexual activity: Yes    Birth control/ protection: Pill   Other Topics Concern  . Not on file   Social History Narrative  . No narrative on file   Family History  Problem Relation Age of Onset  . Breast cancer Mother   . Diabetes Mother   . Prostate cancer Father   . Diabetes Father   . Cancer Sister   . Cirrhosis Sister        Objective:    BP 112/62 (BP Location: Right Arm, Patient Position: Sitting, Cuff Size: Small)   Pulse 89    Temp 98.2 F (36.8 C) (Oral)   Ht 5' 1.75" (1.568 m)   Wt 119 lb 3.2 oz (54.1 kg)   LMP 06/09/2016   SpO2 97%   BMI 21.98 kg/m  Physical Exam  Constitutional: She is oriented to person, place, and time. She appears well-developed and well-nourished. No distress.  HENT:  Head: Normocephalic and atraumatic.  Right Ear: Tympanic membrane, external ear and ear canal normal.  Left Ear: Tympanic membrane, external ear and ear canal normal.  Nose: Nose normal.  Mouth/Throat: Uvula is midline and mucous membranes are normal. Posterior oropharyngeal erythema present.  Eyes: Conjunctivae and EOM are normal. Pupils are equal, round, and reactive to light.  Neck: Normal range of motion. Neck supple. Carotid bruit is not present. No thyromegaly present.  Cardiovascular: Normal rate, regular rhythm, normal heart sounds and intact distal pulses.  Exam reveals no gallop and no friction rub.   No murmur heard. Pulmonary/Chest: Effort normal and breath sounds normal. She has no wheezes. She has no rales.  Abdominal: Soft. Bowel sounds are normal. She exhibits no distension and no mass. There is no tenderness. There is no rebound and no guarding.  Musculoskeletal:       Right hip: Normal.       Left hip: Normal.       Lumbar back: Normal.  Lymphadenopathy:    She has no cervical adenopathy.  Neurological: She is alert and oriented to person, place, and time. No cranial nerve deficit.  Skin: Skin is warm and dry. No rash noted. She is not diaphoretic. No erythema. No pallor.  Psychiatric: She has a normal mood and affect. Her behavior is normal.    Depression screen [facility] 2/9 06/21/2016  Decreased Interest 0  Down, Depressed, Hopeless 0  PHQ - 2 Score 0       Assessment & Plan:   1. DDD (degenerative disc disease), cervical   2. Sore throat   3. Generalized anxiety disorder   4. Piriformis syndrome of left side   5. Attention deficit hyperactivity disorder (ADHD), combined type   6.  Encounter for surveillance of contraceptive pills    -acute illness with sore throat; send throat culture; supportive care with rest, fluids, Ibuprofen.  Rx for Sudafed provided to treat PND which is causing sore throat intermittently; RTC for acute worsening. -chronic cervical neck pain/sprain; refill of Mobic and Flexeril and Tramadol for PRN use.  -refill fo Valium provided for PRN use; one rx to last one year.   -refill of Adderall provided to use with upcoming studying only. -expressed concern to pt regarding use of several controlled substances; pt reassures provider that all rx will last one year.  Review of [place] Controlled Substance Registry; no controlled substances filled in past six months.   Orders Placed This Encounter  Procedures  .  Culture, Group A Strep    Order Specific Question:   Source    Answer:   oropharynx  . POCT rapid strep A   Meds ordered this encounter  Medications  . cyclobenzaprine (FLEXERIL) 5 MG tablet    Sig: Take 1 tablet (5 mg total) by mouth 3 (three) times daily as needed for muscle spasms.    Dispense:  90 tablet    Refill:  0  . DISCONTD: diazepam (VALIUM) 5 MG tablet    Sig: Take 1 tablet (5 mg total) by mouth daily as needed for anxiety.    Dispense:  30 tablet    Refill:  0  . meloxicam (MOBIC) 15 MG tablet    Sig: Take 1 tablet (15 mg total) by mouth daily as needed for pain.    Dispense:  90 tablet    Refill:  0  . DISCONTD: traMADol (ULTRAM) 50 MG tablet    Sig: Take 1 tablet (50 mg total) by mouth every 8 (eight) hours as needed.    Dispense:  60 tablet    Refill:  0  . DISCONTD: amphetamine-dextroamphetamine (ADDERALL) 10 MG tablet    Sig: Take 1 tablet (10 mg total) by mouth daily before breakfast.    Dispense:  30 tablet    Refill:  0  . amphetamine-dextroamphetamine (ADDERALL) 10 MG tablet    Sig: Take 1 tablet (10 mg total) by mouth daily before breakfast.    Dispense:  30 tablet    Refill:  0  . diazepam (VALIUM) 5 MG tablet      Sig: Take 1 tablet (5 mg total) by mouth daily as needed for anxiety.    Dispense:  30 tablet    Refill:  0  . traMADol (ULTRAM) 50 MG tablet    Sig: Take 1 tablet (50 mg total) by mouth every 8 (eight) hours as needed.    Dispense:  60 tablet    Refill:  0  . pseudoephedrine (SUDAFED) 120 MG 12 hr tablet    Sig: Take 1 tablet (120 mg total) by mouth every 12 (twelve) hours as needed for congestion.    Dispense:  20 tablet    Refill:  0    No Follow-up on file.   Nakoa Ganus Paulita FujitaMartin Logyn Dedominicis, M.D. [facility]  [place] [address] [place], [place]  [id number] [phone] phone [phone] fax

## 2016-06-21 NOTE — Patient Instructions (Addendum)
     IF you received an x-ray today, you will receive an invoice from [facility]. Please contact [facility] at [phone] with questions or concerns regarding your invoice.   IF you received labwork today, you will receive an invoice from [facility]. Please contact [facility] at [phone] with questions or concerns regarding your invoice.   Our billing staff will not be able to assist you with questions regarding bills from these companies.  You will be contacted with the lab results as soon as they are available. The fastest way to get your results is to activate your My Chart account. Instructions are located on the last page of this paperwork. If you have not heard from us regarding the results in 2 weeks, please contact this office.     Sore Throat A sore throat is pain, burning, irritation, or scratchiness in the throat. When you have a sore throat, you may feel pain or tenderness in your throat when you swallow or talk. Many things can cause a sore throat, including:  An infection.  Seasonal allergies.  Dryness in the air.  Irritants, such as smoke or pollution.  Gastroesophageal reflux disease (GERD).  A tumor. A sore throat is often the first sign of another sickness. It may happen with other symptoms, such as coughing, sneezing, fever, and swollen neck glands. Most sore throats go away without medical treatment. Follow these instructions at home:  Take over-the-counter medicines only as told by your health care provider.  Drink enough fluids to keep your urine clear or pale yellow.  Rest as needed.  To help with pain, try:  Sipping warm liquids, such as broth, herbal tea, or warm water.  Eating or drinking cold or frozen liquids, such as frozen ice pops.  Gargling with a salt-water mixture 3-4 times a day or as needed. To make a salt-water mixture, completely dissolve -1 tsp of salt in 1 cup of warm water.  Sucking on hard candy or throat  lozenges.  Putting a cool-mist humidifier in your bedroom at night to moisten the air.  Sitting in the bathroom with the door closed for 5-10 minutes while you run hot water in the shower.  Do not use any tobacco products, such as cigarettes, chewing tobacco, and e-cigarettes. If you need help quitting, ask your health care provider. Contact a health care provider if:  You have a fever for more than 2-3 days.  You have symptoms that last (are persistent) for more than 2-3 days.  Your throat does not get better within 7 days.  You have a fever and your symptoms suddenly get worse. Get help right away if:  You have difficulty breathing.  You cannot swallow fluids, soft foods, or your saliva.  You have increased swelling in your throat or neck.  You have persistent nausea and vomiting. This information is not intended to replace advice given to you by your health care provider. Make sure you discuss any questions you have with your health care provider. Document Released: 07/24/2004 Document Revised: 02/10/2016 Document Reviewed: 04/06/2015 Elsevier Interactive Patient Education  2017 Elsevier Inc.  

## 2016-06-22 ENCOUNTER — Telehealth: Payer: Self-pay | Admitting: Family Medicine

## 2016-06-22 MED ORDER — AMOXICILLIN 500 MG PO TABS: 1000.0000 mg | ORAL_TABLET | Freq: Two times a day (BID) | ORAL | 0 refills | Status: AC

## 2016-06-22 NOTE — Telephone Encounter (Signed)
Spoke with patient's husband --- still having mucous congestion; sore throat but not painful.  Lots of hoarseness.  Concern is due to phlegm is green.  Actually felt better yesterday; feeling worse today.  Leaving to go out of town on 06/24/16.  A/P:  URI with new onset/recurrent malaise: concern for developing sinusitis; continue sudafed for the next 48 hours; if no improvement on 12/26 start Amoxicillin; rx sent in.

## 2016-06-26 LAB — CULTURE, GROUP A STREP

## 2016-06-27 ENCOUNTER — Telehealth: Payer: Self-pay

## 2016-06-27 NOTE — Telephone Encounter (Signed)
PA completed for adderall on [email or web address].

## 2016-06-28 NOTE — Telephone Encounter (Signed)
Fax received from [facility] Adderall approved 06/27/2016-06/28/2019

## 2016-07-07 DIAGNOSIS — G5702 Lesion of sciatic nerve, left lower limb: Secondary | ICD-10-CM | POA: Insufficient documentation

## 2016-07-07 DIAGNOSIS — F411 Generalized anxiety disorder: Secondary | ICD-10-CM | POA: Insufficient documentation

## 2017-11-19 ENCOUNTER — Encounter: Payer: Self-pay | Admitting: Family Medicine

## 2017-12-14 ENCOUNTER — Other Ambulatory Visit: Payer: Self-pay | Admitting: Physician Assistant

## 2017-12-14 DIAGNOSIS — Z1231 Encounter for screening mammogram for malignant neoplasm of breast: Secondary | ICD-10-CM

## 2018-01-05 ENCOUNTER — Ambulatory Visit: Payer: Commercial Indemnity

## 2019-08-26 ENCOUNTER — Other Ambulatory Visit: Payer: Self-pay | Admitting: Physician Assistant

## 2019-08-26 ENCOUNTER — Other Ambulatory Visit: Payer: Self-pay | Admitting: Nurse Practitioner

## 2019-08-26 DIAGNOSIS — R922 Inconclusive mammogram: Secondary | ICD-10-CM

## 2019-08-29 ENCOUNTER — Other Ambulatory Visit: Payer: Self-pay | Admitting: Nurse Practitioner

## 2019-08-29 DIAGNOSIS — R922 Inconclusive mammogram: Secondary | ICD-10-CM

## 2020-10-24 ENCOUNTER — Ambulatory Visit: Payer: Commercial Indemnity | Admitting: Women's Health

## 2023-03-23 ENCOUNTER — Other Ambulatory Visit: Payer: Self-pay | Admitting: Physician Assistant

## 2023-03-23 DIAGNOSIS — Z1231 Encounter for screening mammogram for malignant neoplasm of breast: Secondary | ICD-10-CM

## 2023-07-09 ENCOUNTER — Other Ambulatory Visit: Payer: Self-pay | Admitting: Physician Assistant

## 2023-07-09 DIAGNOSIS — Z1231 Encounter for screening mammogram for malignant neoplasm of breast: Secondary | ICD-10-CM

## 2023-07-17 ENCOUNTER — Other Ambulatory Visit: Payer: Self-pay | Admitting: Physician Assistant

## 2023-07-17 DIAGNOSIS — Z1231 Encounter for screening mammogram for malignant neoplasm of breast: Secondary | ICD-10-CM

## 2024-02-15 ENCOUNTER — Emergency Department (HOSPITAL_COMMUNITY): Admission: EM | Admit: 2024-02-15 | Discharge: 2024-02-15 | Disposition: A | Discharge status: Home / Self Care

## 2024-02-15 ENCOUNTER — Other Ambulatory Visit: Payer: Self-pay

## 2024-02-15 ENCOUNTER — Encounter (HOSPITAL_COMMUNITY): Payer: Self-pay | Admitting: *Deleted

## 2024-02-15 DIAGNOSIS — T7840XA Allergy, unspecified, initial encounter: Secondary | ICD-10-CM | POA: Diagnosis present

## 2024-02-15 DIAGNOSIS — T782XXA Anaphylactic shock, unspecified, initial encounter: Secondary | ICD-10-CM | POA: Insufficient documentation

## 2024-02-15 MED ORDER — FAMOTIDINE IN NACL 20-0.9 MG/50ML-% IV SOLN
20.0000 mg | Freq: Once | INTRAVENOUS | Status: AC
  Administered 2024-02-15: 20 mg via INTRAVENOUS
  Filled 2024-02-15: qty 50

## 2024-02-15 MED ORDER — EPINEPHRINE 0.3 MG/0.3ML IJ SOAJ
0.3000 mg | Freq: Once | INTRAMUSCULAR | Status: AC
  Administered 2024-02-15: 0.3 mg via INTRAMUSCULAR
  Filled 2024-02-15: qty 0.3

## 2024-02-15 MED ORDER — METHYLPREDNISOLONE SODIUM SUCC 125 MG IJ SOLR
125.0000 mg | Freq: Every day | INTRAMUSCULAR | Status: DC
Start: 2024-02-15 — End: 2024-02-16
  Administered 2024-02-15: 125 mg via INTRAVENOUS
  Filled 2024-02-15: qty 2

## 2024-02-15 MED ORDER — EPINEPHRINE 0.3 MG/0.3ML IJ SOAJ: 0.3000 mg | INTRAMUSCULAR | 0 refills | Status: AC | PRN

## 2024-02-15 MED ORDER — ONDANSETRON HCL 4 MG/2ML IJ SOLN
4.0000 mg | Freq: Once | INTRAMUSCULAR | Status: AC
  Administered 2024-02-15: 4 mg via INTRAVENOUS
  Filled 2024-02-15: qty 2

## 2024-02-15 MED ORDER — PREDNISONE 20 MG PO TABS: 40.0000 mg | ORAL_TABLET | Freq: Every day | ORAL | 0 refills | Status: AC

## 2024-02-15 MED ORDER — SODIUM CHLORIDE 0.9 % IV BOLUS
1000.0000 mL | Freq: Once | INTRAVENOUS | Status: AC
  Administered 2024-02-15: 1000 mL via INTRAVENOUS

## 2024-02-15 NOTE — ED Provider Notes (Signed)
 [place] EMERGENCY DEPARTMENT AT [facility] Provider Note   CSN: [id number] Arrival date & time: 02/15/24  2047     Patient presents with: Allergic Reaction   Sandra Mccann is a 53 y.o. female.   53 year old female with a past medical history of allergic reaction presents to the ED status post stings to her back while emptying her car.  She reports that she went to [facility] about herself or drink, reported she noted something biting her in the back, felt itchy all over, then developed difficulty breathing, then developed hives throughout her entire body.  She arrived to the ED hypotensive, BP up some nausea, felt like she was short of breath.  She denies any diarrhea, no other complaints reported at this time.  The history is provided by the patient.  Allergic Reaction Presenting symptoms: difficulty breathing and rash        Prior to Admission medications   Medication Sig Start Date End Date Taking? Authorizing Provider  EPINEPHrine  0.3 mg/0.3 mL IJ SOAJ injection Inject 0.3 mg into the muscle as needed for up to 1 dose for anaphylaxis. 02/15/24  Yes Momoka Stringfield, PA-C  predniSONE  (DELTASONE ) 20 MG tablet Take 2 tablets (40 mg total) by mouth daily for 5 days. 02/15/24 02/20/24 Yes Issa Luster, PA-C  amoxicillin  (AMOXIL ) 500 MG tablet Take 2 tablets (1,000 mg total) by mouth 2 (two) times daily. 06/22/16   Claudene Rayfield HERO, MD  amphetamine -dextroamphetamine  (ADDERALL) 10 MG tablet Take 1 tablet (10 mg total) by mouth daily before breakfast. 06/21/16 07/21/16  Claudene Rayfield HERO, MD  cyclobenzaprine  (FLEXERIL ) 5 MG tablet Take 1 tablet (5 mg total) by mouth 3 (three) times daily as needed for muscle spasms. 06/21/16   Claudene Rayfield HERO, MD  diazepam  (VALIUM ) 5 MG tablet Take 1 tablet (5 mg total) by mouth daily as needed for anxiety. 06/21/16   Claudene Rayfield HERO, MD  meloxicam  (MOBIC ) 15 MG tablet Take 1 tablet (15 mg total) by mouth daily as needed for pain. 06/21/16   Claudene Rayfield HERO, MD  norethindrone -ethinyl estradiol  1/35 (ORTHO-NOVUM, NORTREL,CYCLAFEM) tablet Take 1 tablet by mouth daily. 10/17/14   Joylene Lamar SQUIBB, MD  pseudoephedrine  (SUDAFED) 120 MG 12 hr tablet Take 1 tablet (120 mg total) by mouth every 12 (twelve) hours as needed for congestion. 06/21/16   Smith, Kristi M, MD  traMADol  (ULTRAM ) 50 MG tablet Take 1 tablet (50 mg total) by mouth every 8 (eight) hours as needed. 06/21/16   Claudene Rayfield HERO, MD    Allergies: Bee venom    Review of Systems  Constitutional:  Negative for fever.  Respiratory:  Positive for shortness of breath.   Cardiovascular:  Negative for chest pain.  Gastrointestinal:  Positive for nausea. Negative for abdominal pain and vomiting.  Genitourinary:  Negative for flank pain.  Musculoskeletal:  Negative for back pain.  Skin:  Positive for rash.  All other systems reviewed and are negative.   Updated Vital Signs BP 113/76   Pulse 79   Temp 98.1 F (36.7 C) (Oral)   Resp 20   SpO2 94%   Physical Exam Vitals and nursing note reviewed.  Constitutional:      General: She is in acute distress.     Appearance: She is not toxic-appearing.  HENT:     Head: Normocephalic and atraumatic.     Mouth/Throat:     Mouth: Mucous membranes are moist.     Comments: Oropharynx is clear, no edema, no  trouble with secretions.  Cardiovascular:     Rate and Rhythm: Tachycardia present.  Pulmonary:     Effort: Pulmonary effort is normal.     Breath sounds: No wheezing.     Comments: No wheezing, no rales or rhonchi.  Abdominal:     General: Abdomen is flat.     Palpations: Abdomen is soft.     Tenderness: There is no abdominal tenderness. There is no right CVA tenderness or left CVA tenderness.  Musculoskeletal:     Cervical back: Normal range of motion and neck supple.  Skin:    General: Skin is warm and dry.     Findings: Rash present.     Comments: Rash throughout her entire body especially extremities.    Neurological:     Mental Status: She is alert and oriented to person, place, and time.     (all labs ordered are listed, but only abnormal results are displayed) Labs Reviewed - No data to display  EKG: None  Radiology: No results found.   .Critical Care  Performed by: Jaelene Garciagarcia, PA-C Authorized by: Kermit Arnette, PA-C   Critical care provider statement:    Critical care time (minutes):  30   Critical care start time:  02/15/2024 11:43 PM   Critical care end time:  02/15/2024 11:43 PM   Critical care was necessary to treat or prevent imminent or life-threatening deterioration of the following conditions:  Toxidrome   Critical care was time spent personally by me on the following activities:  Development of treatment plan with patient or surrogate, discussions with consultants, evaluation of patient's response to treatment, examination of patient, ordering and review of laboratory studies, ordering and review of radiographic studies, ordering and performing treatments and interventions, pulse oximetry, re-evaluation of patient's condition and review of old charts    Medications Ordered in the ED  methylPREDNISolone  sodium succinate (SOLU-MEDROL ) 125 mg/2 mL injection 125 mg (125 mg Intravenous Given 02/15/24 2125)  EPINEPHrine  (EPI-PEN) injection 0.3 mg (0.3 mg Intramuscular Given 02/15/24 2114)  ondansetron  (ZOFRAN ) injection 4 mg (4 mg Intravenous Given 02/15/24 2113)  famotidine  (PEPCID ) IVPB 20 mg premix (0 mg Intravenous Stopped 02/15/24 2311)  sodium chloride  0.9 % bolus 1,000 mL (1,000 mLs Intravenous Bolus 02/15/24 2118)                                    Medical Decision Making Risk Prescription drug management.   Patient presented to the ED status post allergic reaction after being stung by bees on her back while getting her drink at [facility].  Reports a prior allergy to this.  States that she began to feel short of breath, nauseated, broke out into a rash  To the  ED hypotensive with maps in the 60s, tachycardic with a heart rate in the 1 teens.  Given epinephrine  upon evaluation by me.  She did not have any wheezing, oropharynx is clear without any edema or swelling noted.  She is tolerating her secretions but does appear nauseated also given some Zofran .  She was also given Pepcid , Solu-Medrol , fluids, to help with symptomatic treatment.  She does report improvement in her rash after multiple rechecks.  She has been in the emergency department for approximately 3 hours without any rebound and anaphylaxis.  She is tolerating her secretions adequately.  Did discuss with her short course of steroids along with EpiPen  prescription.  She is agreeable of  this at this time.  She remains hemodynamically stable for discharge.  BP 113/76   Pulse 79   Temp 98.1 F (36.7 C) (Oral)   Resp 20   SpO2 94%    Portions of this note were generated with Scientist, clinical (histocompatibility and immunogenetics). Dictation errors may occur despite best attempts at proofreading.      Final diagnoses:  Anaphylaxis, initial encounter    ED Discharge Orders          Ordered    predniSONE  (DELTASONE ) 20 MG tablet  Daily        02/15/24 2342    EPINEPHrine  0.3 mg/0.3 mL IJ SOAJ injection  As needed        02/15/24 2342               Rettie Laird, PA-C 02/15/24 2345    Simon Lavonia SAILOR, MD 02/16/24 2128

## 2024-02-15 NOTE — Discharge Instructions (Signed)
 You are given a short prescription for steroid steroid to help with the swelling, please take 2 tablets daily for the next 5 days.  Please be aware this medication can make you flushed, appetite changes, insomnia.  You are also given a prescription for epinephrine , you may use this if experience any anaphylactic reaction in the future.

## 2024-02-15 NOTE — ED Triage Notes (Signed)
 She says that she came out of [employer] and she started pinching biting on her torso just PTA. Pt has hives on her torso, she says that she feels itching all over and she feels like she is having difficulty breathing. 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 She says she is taking antibiotics she got in [place] for an infection for her nipple ring.

## 2024-04-27 ENCOUNTER — Other Ambulatory Visit: Payer: Self-pay | Admitting: Physician Assistant

## 2024-04-27 DIAGNOSIS — Z1239 Encounter for other screening for malignant neoplasm of breast: Secondary | ICD-10-CM

## 2024-04-27 DIAGNOSIS — N649 Disorder of breast, unspecified: Secondary | ICD-10-CM

## 2024-04-27 DIAGNOSIS — Z803 Family history of malignant neoplasm of breast: Secondary | ICD-10-CM
